# Patient Record
Sex: Female | Born: 1975 | Race: Black or African American | Hispanic: No | Marital: Single | Smoking: Never smoker
Health system: Southern US, Community
[De-identification: ages and names within clinical notes are randomized; demographics above are authoritative.]

---

## 2002-01-06 ENCOUNTER — Other Ambulatory Visit: Admission: RE | Admit: 2002-01-06 | Discharge: 2002-01-06 | Payer: Self-pay | Admitting: Obstetrics and Gynecology

## 2002-03-06 ENCOUNTER — Other Ambulatory Visit: Admission: RE | Admit: 2002-03-06 | Discharge: 2002-03-06 | Payer: Self-pay | Admitting: *Deleted

## 2005-03-16 ENCOUNTER — Encounter: Admission: RE | Admit: 2005-03-16 | Discharge: 2005-03-16 | Payer: Self-pay | Admitting: Gastroenterology

## 2005-10-23 ENCOUNTER — Encounter: Admission: RE | Admit: 2005-10-23 | Discharge: 2005-10-23 | Payer: Self-pay | Admitting: Gastroenterology

## 2007-09-21 ENCOUNTER — Encounter: Admission: RE | Admit: 2007-09-21 | Discharge: 2007-09-21 | Payer: Self-pay | Admitting: Internal Medicine

## 2007-11-01 ENCOUNTER — Encounter: Admission: RE | Admit: 2007-11-01 | Discharge: 2007-11-01 | Payer: Self-pay | Admitting: Internal Medicine

## 2010-05-20 ENCOUNTER — Other Ambulatory Visit
Admission: RE | Admit: 2010-05-20 | Discharge: 2010-05-20 | Payer: Self-pay | Source: Home / Self Care | Admitting: Family Medicine

## 2010-05-29 ENCOUNTER — Encounter
Admission: RE | Admit: 2010-05-29 | Discharge: 2010-05-29 | Payer: Self-pay | Source: Home / Self Care | Attending: Family Medicine | Admitting: Family Medicine

## 2010-06-15 ENCOUNTER — Encounter: Payer: Self-pay | Admitting: Family Medicine

## 2010-06-25 ENCOUNTER — Encounter: Payer: Self-pay | Admitting: Family Medicine

## 2010-07-10 ENCOUNTER — Other Ambulatory Visit: Payer: Self-pay | Admitting: Family Medicine

## 2010-07-10 DIAGNOSIS — N852 Hypertrophy of uterus: Secondary | ICD-10-CM

## 2010-07-12 ENCOUNTER — Ambulatory Visit
Admission: RE | Admit: 2010-07-12 | Discharge: 2010-07-12 | Disposition: A | Discharge status: Home / Self Care | Payer: 59 | Source: Ambulatory Visit | Attending: Family Medicine | Admitting: Family Medicine

## 2010-07-12 DIAGNOSIS — N852 Hypertrophy of uterus: Secondary | ICD-10-CM

## 2011-07-15 ENCOUNTER — Ambulatory Visit (INDEPENDENT_AMBULATORY_CARE_PROVIDER_SITE_OTHER): Payer: 59 | Admitting: Family Medicine

## 2011-07-15 VITALS — BP 125/83 | HR 77 | Temp 97.9°F | Resp 16 | Ht 65.0 in | Wt 223.4 lb

## 2011-07-15 DIAGNOSIS — N390 Urinary tract infection, site not specified: Secondary | ICD-10-CM

## 2011-07-15 DIAGNOSIS — R350 Frequency of micturition: Secondary | ICD-10-CM

## 2011-07-15 LAB — POCT URINALYSIS DIPSTICK
Bilirubin, UA: NEGATIVE
Glucose, UA: NEGATIVE
Nitrite, UA: NEGATIVE
Protein, UA: 100
Spec Grav, UA: 1.025
pH, UA: 6.5

## 2011-07-15 LAB — POCT UA - MICROSCOPIC ONLY
Bacteria, U Microscopic: NEGATIVE
Casts, Ur, LPF, POC: NEGATIVE
Crystals, Ur, HPF, POC: NEGATIVE
Mucus, UA: NEGATIVE
Yeast, UA: NEGATIVE

## 2011-07-15 MED ORDER — FLUCONAZOLE 150 MG PO TABS: ORAL_TABLET | ORAL | Status: AC

## 2011-07-15 MED ORDER — CIPROFLOXACIN HCL 250 MG PO TABS: 250.0000 mg | ORAL_TABLET | Freq: Two times a day (BID) | ORAL | Status: AC

## 2011-07-15 NOTE — Progress Notes (Signed)
  Subjective:    Patient ID: Lauren Gilbert, female    DOB: 01/18/1976, 36 y.o.   MRN: [id number]  Urinary Tract Infection  This is a new problem. The current episode started today. The problem occurs every urination. The problem has been gradually worsening. The pain is at a severity of 4/10. The pain is moderate. There has been no fever. There is no history of pyelonephritis. Associated symptoms include hematuria and urgency. Pertinent negatives include no flank pain or nausea.      Review of Systems  Gastrointestinal: Negative for nausea.  Genitourinary: Positive for urgency and hematuria. Negative for flank pain.       Objective:   Physical Exam  Constitutional: She appears well-developed and well-nourished.  HENT:  Mouth/Throat: Oropharynx is clear and moist.  Neck: Neck supple.  Cardiovascular: Normal rate, regular rhythm and normal heart sounds.   Pulmonary/Chest: Effort normal and breath sounds normal.  Abdominal: There is tenderness (suprapubic).  Neurological: She is alert.  Skin: Skin is warm.      Results for orders placed in visit on 07/15/11  POCT URINALYSIS DIPSTICK      Component Value Range   Color, UA red     Clarity, UA cloudy     Glucose, UA negative     Bilirubin, UA negative     Ketones, UA trace     Spec Grav, UA 1.025     Blood, UA large     pH, UA 6.5     Protein, UA 100     Urobilinogen, UA 1.0     Nitrite, UA negative     Leukocytes, UA large (3+)    POCT UA - MICROSCOPIC ONLY      Component Value Range   WBC, Ur, HPF, POC TNTC     RBC, urine, microscopic TNTC     Bacteria, U Microscopic negative     Mucus, UA negative     Epithelial cells, urine per micros 1-5     Crystals, Ur, HPF, POC negative     Casts, Ur, LPF, POC negative     Yeast, UA negative    URINE CULTURE      Component Value Range   Colony Count 30,000 COLONIES/ML     Organism ID, Bacteria Multiple bacterial morphotypes present, none     Organism ID, Bacteria  predominant. Suggest appropriate recollection if      Organism ID, Bacteria clinically indicated.         Assessment & Plan:   1. Frequency of urination  POCT urinalysis dipstick, POCT UA - Microscopic Only, Urine culture, ciprofloxacin (CIPRO) 250 MG tablet, fluconazole (DIFLUCAN) 150 MG tablet

## 2011-07-17 LAB — URINE CULTURE: Colony Count: 30000

## 2012-08-11 ENCOUNTER — Other Ambulatory Visit: Payer: Self-pay | Admitting: Family Medicine

## 2012-08-11 DIAGNOSIS — E010 Iodine-deficiency related diffuse (endemic) goiter: Secondary | ICD-10-CM

## 2012-08-15 ENCOUNTER — Ambulatory Visit
Admission: RE | Admit: 2012-08-15 | Discharge: 2012-08-15 | Disposition: A | Discharge status: Home / Self Care | Payer: 59 | Source: Ambulatory Visit | Attending: Family Medicine | Admitting: Family Medicine

## 2012-08-15 DIAGNOSIS — E010 Iodine-deficiency related diffuse (endemic) goiter: Secondary | ICD-10-CM

## 2013-04-05 ENCOUNTER — Ambulatory Visit: Payer: 59 | Attending: Family

## 2013-09-21 ENCOUNTER — Encounter (INDEPENDENT_AMBULATORY_CARE_PROVIDER_SITE_OTHER): Payer: Self-pay

## 2013-09-21 ENCOUNTER — Other Ambulatory Visit: Payer: Self-pay | Admitting: Family

## 2013-09-21 ENCOUNTER — Ambulatory Visit
Admission: RE | Admit: 2013-09-21 | Discharge: 2013-09-21 | Disposition: A | Discharge status: Home / Self Care | Payer: 59 | Source: Ambulatory Visit | Attending: Family | Admitting: Family

## 2013-09-21 DIAGNOSIS — M79609 Pain in unspecified limb: Secondary | ICD-10-CM

## 2014-03-23 ENCOUNTER — Ambulatory Visit: Payer: Self-pay

## 2019-02-03 ENCOUNTER — Ambulatory Visit (INDEPENDENT_AMBULATORY_CARE_PROVIDER_SITE_OTHER): Payer: BC Managed Care – PPO

## 2019-02-03 ENCOUNTER — Other Ambulatory Visit: Payer: Self-pay

## 2019-02-03 ENCOUNTER — Ambulatory Visit (INDEPENDENT_AMBULATORY_CARE_PROVIDER_SITE_OTHER)
Admission: EM | Admit: 2019-02-03 | Discharge: 2019-02-03 | Disposition: A | Discharge status: Home / Self Care | Payer: BC Managed Care – PPO | Source: Home / Self Care

## 2019-02-03 ENCOUNTER — Encounter (HOSPITAL_COMMUNITY): Payer: Self-pay | Admitting: Emergency Medicine

## 2019-02-03 ENCOUNTER — Observation Stay (HOSPITAL_COMMUNITY)
Admission: EM | Admit: 2019-02-03 | Discharge: 2019-02-05 | Disposition: A | Discharge status: Home / Self Care | Payer: BC Managed Care – PPO | Attending: General Surgery | Admitting: General Surgery

## 2019-02-03 DIAGNOSIS — R140 Abdominal distension (gaseous): Secondary | ICD-10-CM

## 2019-02-03 DIAGNOSIS — R109 Unspecified abdominal pain: Secondary | ICD-10-CM | POA: Diagnosis present

## 2019-02-03 DIAGNOSIS — Z20828 Contact with and (suspected) exposure to other viral communicable diseases: Secondary | ICD-10-CM | POA: Diagnosis not present

## 2019-02-03 DIAGNOSIS — K8010 Calculus of gallbladder with chronic cholecystitis without obstruction: Principal | ICD-10-CM | POA: Insufficient documentation

## 2019-02-03 DIAGNOSIS — K810 Acute cholecystitis: Secondary | ICD-10-CM | POA: Diagnosis present

## 2019-02-03 DIAGNOSIS — R1084 Generalized abdominal pain: Secondary | ICD-10-CM | POA: Insufficient documentation

## 2019-02-03 DIAGNOSIS — K5900 Constipation, unspecified: Secondary | ICD-10-CM

## 2019-02-03 DIAGNOSIS — K819 Cholecystitis, unspecified: Secondary | ICD-10-CM

## 2019-02-03 DIAGNOSIS — K660 Peritoneal adhesions (postprocedural) (postinfection): Secondary | ICD-10-CM | POA: Diagnosis not present

## 2019-02-03 LAB — CBC
HCT: 38.2 % (ref 36.0–46.0)
Hemoglobin: 12.7 g/dL (ref 12.0–15.0)
MCH: 28.6 pg (ref 26.0–34.0)
MCHC: 33.2 g/dL (ref 30.0–36.0)
MCV: 86 fL (ref 80.0–100.0)
Platelets: 414 10*3/uL — ABNORMAL HIGH (ref 150–400)
RBC: 4.44 MIL/uL (ref 3.87–5.11)
RDW: 12.9 % (ref 11.5–15.5)
WBC: 17.9 10*3/uL — ABNORMAL HIGH (ref 4.0–10.5)
nRBC: 0 % (ref 0.0–0.2)

## 2019-02-03 LAB — COMPREHENSIVE METABOLIC PANEL
ALT: 29 U/L (ref 0–44)
AST: 25 U/L (ref 15–41)
Albumin: 3.6 g/dL (ref 3.5–5.0)
Alkaline Phosphatase: 61 U/L (ref 38–126)
Anion gap: 9 (ref 5–15)
BUN: <5 mg/dL — ABNORMAL LOW (ref 6–20)
CO2: 28 mmol/L (ref 22–32)
Calcium: 9.1 mg/dL (ref 8.9–10.3)
Chloride: 101 mmol/L (ref 98–111)
Creatinine, Ser: 0.9 mg/dL (ref 0.44–1.00)
GFR calc Af Amer: >60 mL/min (ref >60)
GFR calc non Af Amer: >60 mL/min (ref >60)
Glucose, Bld: 104 mg/dL — ABNORMAL HIGH (ref 70–99)
Potassium: 3.5 mmol/L (ref 3.5–5.1)
Sodium: 138 mmol/L (ref 135–145)
Total Bilirubin: 1.1 mg/dL (ref 0.3–1.2)
Total Protein: 7.8 g/dL (ref 6.5–8.1)

## 2019-02-03 LAB — LIPASE, BLOOD: Lipase: 20 U/L (ref 11–51)

## 2019-02-03 NOTE — ED Triage Notes (Signed)
Pt c/o abd pain, constipation. Was sent from UC for further f/u.

## 2019-02-03 NOTE — ED Provider Notes (Signed)
[facility]    CSN: [id number] Arrival date & time: 02/03/19  1557      History   Chief Complaint Chief Complaint  Patient presents with  . Abdominal Pain  . Constipation    HPI Lauren Gilbert is a 43 y.o. female.   Lauren Gilbert presents with complaints of abdominal pain and constipation. This started approximately three days ago, she woke with intense pain. She has had poor sleep due to pain. No specific fevers but has had some night sweats. She feels intense pain at times, for a day or two felt like she couldn't burp or pass gas. She took miralax and was able to pass "small pellets" of stool. Some flatulance still but not a lot. Some burping. Has been drinking water and ginger ale. She vomited the night this started, 9/8, the meal she had for dinner. No blood to vomit. States she feels like the "food is blocked." she has to " breathe through the pain" as if she "is in labor." even talking can increase her pain. She has been eating only small bites of foods such as crackers. Last night ate chicken wings and fried rice and then symptoms worsened again. Normal urination. Just started her period. Denies any previous similar. No previous abdominal surgeries. States she feels she has had constipation issues for some time now. Has not had this evaluated in the past for this.     ROS per HPI, negative if not otherwise mentioned.      History reviewed. No pertinent past medical history.  There are no active problems to display for this patient.   History reviewed. No pertinent surgical history.  OB History   No obstetric history on file.      Home Medications    Prior to Admission medications   Medication Sig Start Date End Date Taking? Authorizing Provider  buPROPion (WELLBUTRIN) 75 MG tablet Take 75 mg by mouth 2 (two) times daily.    [provider]    Family History History reviewed. No pertinent family history.  Social History Social History    Tobacco Use  . Smoking status: Never Smoker  . Smokeless tobacco: Never Used  Substance Use Topics  . Alcohol use: Yes    Comment: ocassional  . Drug use: Never     Allergies   Patient has no known allergies.   Review of Systems Review of Systems   Physical Exam Triage Vital Signs ED Triage Vitals  Enc Vitals Group     BP 02/03/19 1611 (!) 135/102     Pulse Rate 02/03/19 1611 (!) 103     Resp 02/03/19 1611 18     Temp 02/03/19 1611 98.9 F (37.2 C)     Temp Source 02/03/19 1611 Temporal     SpO2 02/03/19 1611 99 %     Weight --      Height --      Head Circumference --      Peak Flow --      Pain Score 02/03/19 1614 7     Pain Loc --      Pain Edu? --      Excl. in GC? --    No data found.  Updated Vital Signs BP (!) 135/102 (BP Location: Right Wrist)   Pulse (!) 103   Temp 98.9 F (37.2 C) (Temporal)   Resp 18   LMP 02/03/2019 (Exact Date)   SpO2 99%   Physical Exam Constitutional:  Appearance: She is well-developed.     Comments: Patient does appear quite uncomfortable with movement, becoming tachypneic due to pain.   Cardiovascular:     Rate and Rhythm: Tachycardia present.     Heart sounds: Normal heart sounds.  Pulmonary:     Effort: Pulmonary effort is normal.  Abdominal:     Palpations: Abdomen is soft.     Tenderness: There is abdominal tenderness in the right upper quadrant, epigastric area, periumbilical area and left upper quadrant. There is no guarding or rebound.  Skin:    General: Skin is warm and dry.  Neurological:     Mental Status: She is alert and oriented to person, place, and time.      UC Treatments / Results  Labs (all labs ordered are listed, but only abnormal results are displayed) Labs Reviewed  CBC - Abnormal; Notable for the following components:      Result Value   WBC 17.9 (*)    Platelets 414 (*)    All other components within normal limits  COMPREHENSIVE METABOLIC PANEL - Abnormal; Notable for the  following components:   Glucose, Bld 104 (*)    BUN <5 (*)    All other components within normal limits  LIPASE, BLOOD    EKG   Radiology Dg Abd 2 Views  Result Date: 02/03/2019 CLINICAL DATA:  Acute abdominal pain for 4 days. EXAM: ABDOMEN - 2 VIEW COMPARISON:  None. FINDINGS: The bowel gas pattern is normal. There is no evidence of free air. No radio-opaque calculi or other significant radiographic abnormality is seen. IMPRESSION: Negative. Electronically Signed   By: Margarette Canada M.D.   On: 02/03/2019 18:02    Procedures Procedures (including critical care time)  Medications Ordered in UC Medications - No data to display  Initial Impression / Assessment and Plan / UC Course  I have reviewed the triage vital signs and the nursing notes.  Pertinent labs & imaging results that were available during my care of the patient were reviewed by me and considered in my medical decision making (see chart for details).     Quite significant abdominal pain, mild tachycardia. Endorses constipation, unable to pass large stool despite miralax. Abdominal films without indication of obstruction and gas pattern WNL. Afebrile here tonight. Patient agreeable to increasing miralax dosing with lab check with strict er precautions.    Labs returned with notable WBC of 17.9. normal lipase and unremarkable CMP. I do find this concerning given tachycardia and severity of pain. Called patient and recommended that she go to the ER for further evaluation of her abdominal pain. Patient verbalized understanding and agreeable to plan.   Final Clinical Impressions(s) / UC Diagnoses   Final diagnoses:  Constipation, unspecified constipation type  Generalized abdominal pain  Abdominal bloating     Discharge Instructions     You can titrate miralax until you have a large bowel movement, I would take a dose tonight and if you stay up late enough even a second dose.  Repeat dose tomorrow morning and tomorrow  evening (unless have passed a large BM).  Then may take daily as needed.  Drink plenty of water.  We will obtain some labs to ensure no obvious concerns going on, and I will call you if I am concerned with these.  Any worsening of symptoms- increased pain, fevers, or otherwise worsening- please go to the ER.  Please establish with a PCP as you may need GI referral for persistent issues.  ED Prescriptions    None     Controlled Substance Prescriptions [place] Controlled Substance Registry consulted? Not Applicable   Georgetta HaberBurky,  B, NP 02/03/19 2041

## 2019-02-03 NOTE — Discharge Instructions (Signed)
You can titrate miralax until you have a large bowel movement, I would take a dose tonight and if you stay up late enough even a second dose.  Repeat dose tomorrow morning and tomorrow evening (unless have passed a large BM).  Then may take daily as needed.  Drink plenty of water.  We will obtain some labs to ensure no obvious concerns going on, and I will call you if I am concerned with these.  Any worsening of symptoms- increased pain, fevers, or otherwise worsening- please go to the ER.  Please establish with a PCP as you may need GI referral for persistent issues.

## 2019-02-03 NOTE — ED Triage Notes (Signed)
Pt states her last BM was on Monday, with a description of it being just pellets.  On Tuesday she woke up with a lot of pain in her diaphragm.  She has taken two different medications for constipation with no relief.  On Thursday night she had another small BM described as pellets again. She reports bloating, cramping, gas and a lot of discomfort.

## 2019-02-04 ENCOUNTER — Encounter (HOSPITAL_COMMUNITY)
Admission: EM | Disposition: A | Discharge status: Home / Self Care | Payer: Self-pay | Source: Home / Self Care | Attending: Emergency Medicine

## 2019-02-04 ENCOUNTER — Emergency Department (HOSPITAL_COMMUNITY): Payer: BC Managed Care – PPO

## 2019-02-04 ENCOUNTER — Emergency Department (HOSPITAL_COMMUNITY): Payer: BC Managed Care – PPO | Admitting: Certified Registered Nurse Anesthetist

## 2019-02-04 ENCOUNTER — Encounter (HOSPITAL_COMMUNITY): Payer: Self-pay | Admitting: Radiology

## 2019-02-04 DIAGNOSIS — K810 Acute cholecystitis: Secondary | ICD-10-CM | POA: Diagnosis present

## 2019-02-04 HISTORY — PX: CHOLECYSTECTOMY: SHX55

## 2019-02-04 LAB — I-STAT BETA HCG BLOOD, ED (MC, WL, AP ONLY): I-stat hCG, quantitative: <5 m[IU]/mL (ref <5)

## 2019-02-04 LAB — SARS CORONAVIRUS 2 BY RT PCR (HOSPITAL ORDER, PERFORMED IN ~~LOC~~ HOSPITAL LAB): SARS Coronavirus 2: NEGATIVE

## 2019-02-04 SURGERY — LAPAROSCOPIC CHOLECYSTECTOMY WITH INTRAOPERATIVE CHOLANGIOGRAM
Anesthesia: General | Site: Abdomen

## 2019-02-04 MED ORDER — MIDAZOLAM HCL 2 MG/2ML IJ SOLN
INTRAMUSCULAR | Status: AC
  Filled 2019-02-04: qty 2

## 2019-02-04 MED ORDER — PROPOFOL 10 MG/ML IV BOLUS
INTRAVENOUS | Status: AC
  Filled 2019-02-04: qty 20

## 2019-02-04 MED ORDER — MIDAZOLAM HCL 2 MG/2ML IJ SOLN
INTRAMUSCULAR | Status: DC | PRN
  Administered 2019-02-04: 2 mg via INTRAVENOUS

## 2019-02-04 MED ORDER — SUCCINYLCHOLINE CHLORIDE 200 MG/10ML IV SOSY
PREFILLED_SYRINGE | INTRAVENOUS | Status: DC | PRN
  Administered 2019-02-04: 120 mg via INTRAVENOUS

## 2019-02-04 MED ORDER — LIDOCAINE 2% (20 MG/ML) 5 ML SYRINGE
INTRAMUSCULAR | Status: AC
  Filled 2019-02-04: qty 5

## 2019-02-04 MED ORDER — DEXAMETHASONE SODIUM PHOSPHATE 10 MG/ML IJ SOLN
INTRAMUSCULAR | Status: DC | PRN
  Administered 2019-02-04: 5 mg via INTRAVENOUS

## 2019-02-04 MED ORDER — MORPHINE SULFATE (PF) 2 MG/ML IV SOLN: 2.0000 mg | INTRAVENOUS | Status: DC | PRN

## 2019-02-04 MED ORDER — KETOROLAC TROMETHAMINE 15 MG/ML IJ SOLN
INTRAMUSCULAR | Status: AC
  Filled 2019-02-04: qty 2

## 2019-02-04 MED ORDER — FENTANYL CITRATE (PF) 100 MCG/2ML IJ SOLN: 25.0000 ug | INTRAMUSCULAR | Status: DC | PRN

## 2019-02-04 MED ORDER — GABAPENTIN 300 MG PO CAPS
ORAL_CAPSULE | ORAL | Status: AC
  Administered 2019-02-04: 300 mg
  Filled 2019-02-04: qty 1

## 2019-02-04 MED ORDER — ONDANSETRON HCL 4 MG/2ML IJ SOLN: 4.0000 mg | Freq: Four times a day (QID) | INTRAMUSCULAR | Status: DC | PRN

## 2019-02-04 MED ORDER — BUPIVACAINE HCL 0.25 % IJ SOLN
INTRAMUSCULAR | Status: DC | PRN
  Administered 2019-02-04: 10 mL

## 2019-02-04 MED ORDER — FENTANYL CITRATE (PF) 250 MCG/5ML IJ SOLN
INTRAMUSCULAR | Status: DC | PRN
  Administered 2019-02-04 (×2): 50 ug via INTRAVENOUS
  Administered 2019-02-04 (×3): 100 ug via INTRAVENOUS
  Administered 2019-02-04: 50 ug via INTRAVENOUS

## 2019-02-04 MED ORDER — OXYCODONE HCL 5 MG/5ML PO SOLN: 5.0000 mg | Freq: Once | ORAL | Status: DC | PRN

## 2019-02-04 MED ORDER — FENTANYL CITRATE (PF) 250 MCG/5ML IJ SOLN
INTRAMUSCULAR | Status: AC
  Filled 2019-02-04: qty 5

## 2019-02-04 MED ORDER — SUGAMMADEX SODIUM 200 MG/2ML IV SOLN
INTRAVENOUS | Status: DC | PRN
  Administered 2019-02-04: 400 mg via INTRAVENOUS

## 2019-02-04 MED ORDER — IOHEXOL 300 MG/ML  SOLN
100.0000 mL | Freq: Once | INTRAMUSCULAR | Status: AC | PRN
  Administered 2019-02-04: 09:00:00 100 mL via INTRAVENOUS

## 2019-02-04 MED ORDER — BUPIVACAINE HCL (PF) 0.25 % IJ SOLN
INTRAMUSCULAR | Status: AC
  Filled 2019-02-04: qty 30

## 2019-02-04 MED ORDER — SUCCINYLCHOLINE CHLORIDE 200 MG/10ML IV SOSY
PREFILLED_SYRINGE | INTRAVENOUS | Status: AC
  Filled 2019-02-04: qty 10

## 2019-02-04 MED ORDER — ONDANSETRON 4 MG PO TBDP: 4.0000 mg | ORAL_TABLET | Freq: Four times a day (QID) | ORAL | Status: DC | PRN

## 2019-02-04 MED ORDER — HYDRALAZINE HCL 20 MG/ML IJ SOLN: 10.0000 mg | INTRAMUSCULAR | Status: DC | PRN

## 2019-02-04 MED ORDER — ACETAMINOPHEN 500 MG PO TABS
1000.0000 mg | ORAL_TABLET | Freq: Once | ORAL | Status: AC
  Administered 2019-02-04: 13:00:00 1000 mg via ORAL
  Filled 2019-02-04: qty 2

## 2019-02-04 MED ORDER — PROPOFOL 10 MG/ML IV BOLUS
INTRAVENOUS | Status: DC | PRN
  Administered 2019-02-04: 200 mg via INTRAVENOUS

## 2019-02-04 MED ORDER — GABAPENTIN 600 MG PO TABS
300.0000 mg | ORAL_TABLET | Freq: Once | ORAL | Status: DC
  Filled 2019-02-04: qty 0.5

## 2019-02-04 MED ORDER — BUPIVACAINE-EPINEPHRINE (PF) 0.25% -1:200000 IJ SOLN
INTRAMUSCULAR | Status: AC
  Filled 2019-02-04: qty 30

## 2019-02-04 MED ORDER — PIPERACILLIN-TAZOBACTAM 3.375 G IVPB 30 MIN
3.3750 g | Freq: Once | INTRAVENOUS | Status: AC
  Administered 2019-02-04: 3.375 g via INTRAVENOUS
  Filled 2019-02-04: qty 50

## 2019-02-04 MED ORDER — LIDOCAINE 2% (20 MG/ML) 5 ML SYRINGE
INTRAMUSCULAR | Status: DC | PRN
  Administered 2019-02-04: 40 mg via INTRAVENOUS

## 2019-02-04 MED ORDER — ROCURONIUM BROMIDE 10 MG/ML (PF) SYRINGE
PREFILLED_SYRINGE | INTRAVENOUS | Status: DC | PRN
  Administered 2019-02-04: 60 mg via INTRAVENOUS

## 2019-02-04 MED ORDER — EPHEDRINE SULFATE-NACL 50-0.9 MG/10ML-% IV SOSY
PREFILLED_SYRINGE | INTRAVENOUS | Status: DC | PRN
  Administered 2019-02-04: 10 mg via INTRAVENOUS

## 2019-02-04 MED ORDER — HYDROCODONE-ACETAMINOPHEN 5-325 MG PO TABS: 1.0000 | ORAL_TABLET | ORAL | Status: DC | PRN

## 2019-02-04 MED ORDER — ROCURONIUM BROMIDE 10 MG/ML (PF) SYRINGE
PREFILLED_SYRINGE | INTRAVENOUS | Status: AC
  Filled 2019-02-04: qty 10

## 2019-02-04 MED ORDER — ONDANSETRON HCL 4 MG/2ML IJ SOLN
INTRAMUSCULAR | Status: DC | PRN
  Administered 2019-02-04: 4 mg via INTRAVENOUS

## 2019-02-04 MED ORDER — KETOROLAC TROMETHAMINE 30 MG/ML IJ SOLN
30.0000 mg | Freq: Once | INTRAMUSCULAR | Status: AC
  Administered 2019-02-04: 13:00:00 30 mg via INTRAVENOUS

## 2019-02-04 MED ORDER — SODIUM CHLORIDE 0.9 % IR SOLN
Status: DC | PRN
  Administered 2019-02-04 (×2): 1000 mL

## 2019-02-04 MED ORDER — DEXTROSE-NACL 5-0.45 % IV SOLN
INTRAVENOUS | Status: DC
  Administered 2019-02-04 – 2019-02-05 (×2): via INTRAVENOUS

## 2019-02-04 MED ORDER — 0.9 % SODIUM CHLORIDE (POUR BTL) OPTIME
TOPICAL | Status: DC | PRN
  Administered 2019-02-04: 1000 mL

## 2019-02-04 MED ORDER — ENOXAPARIN SODIUM 40 MG/0.4ML ~~LOC~~ SOLN
40.0000 mg | SUBCUTANEOUS | Status: DC
  Administered 2019-02-05: 08:00:00 40 mg via SUBCUTANEOUS
  Filled 2019-02-04: qty 0.4

## 2019-02-04 MED ORDER — OXYCODONE HCL 5 MG PO TABS: 5.0000 mg | ORAL_TABLET | Freq: Once | ORAL | Status: DC | PRN

## 2019-02-04 MED ORDER — LACTATED RINGERS IV SOLN
INTRAVENOUS | Status: DC
  Administered 2019-02-04 (×2): via INTRAVENOUS

## 2019-02-04 SURGICAL SUPPLY — 48 items
ADH SKN CLS APL DERMABOND .7 (GAUZE/BANDAGES/DRESSINGS) ×1
ADH SKN CLS LQ APL DERMABOND (GAUZE/BANDAGES/DRESSINGS) ×1
APL PRP STRL LF DISP 70% ISPRP (MISCELLANEOUS) ×1
APPLIER CLIP ROT 10 11.4 M/L (STAPLE)
APR CLP MED LRG 11.4X10 (STAPLE)
BAG SPEC RTRVL 10 TROC 200 (ENDOMECHANICALS) ×1
BLADE CLIPPER SURG (BLADE) IMPLANT
CANISTER SUCT 3000ML PPV (MISCELLANEOUS) ×3 IMPLANT
CATH CHOLANG 76X19 KUMAR (CATHETERS) ×3 IMPLANT
CHLORAPREP W/TINT 26 (MISCELLANEOUS) ×3 IMPLANT
CLIP APPLIE ROT 10 11.4 M/L (STAPLE) IMPLANT
CLIP VESOLOCK MED LG 6/CT (CLIP) IMPLANT
COVER MAYO STAND STRL (DRAPES) ×3 IMPLANT
COVER SURGICAL LIGHT HANDLE (MISCELLANEOUS) ×3 IMPLANT
COVER WAND RF STERILE (DRAPES) ×3 IMPLANT
DERMABOND ADHESIVE PROPEN (GAUZE/BANDAGES/DRESSINGS) ×2
DERMABOND ADVANCED (GAUZE/BANDAGES/DRESSINGS) ×2
DERMABOND ADVANCED .7 DNX12 (GAUZE/BANDAGES/DRESSINGS) ×1 IMPLANT
DERMABOND ADVANCED .7 DNX6 (GAUZE/BANDAGES/DRESSINGS) IMPLANT
DRAPE C-ARM 42X72 X-RAY (DRAPES) ×3 IMPLANT
ELECT REM PT RETURN 9FT ADLT (ELECTROSURGICAL) ×3
ELECTRODE REM PT RTRN 9FT ADLT (ELECTROSURGICAL) ×1 IMPLANT
GLOVE BIOGEL PI IND STRL 7.0 (GLOVE) ×1 IMPLANT
GLOVE BIOGEL PI INDICATOR 7.0 (GLOVE) ×2
GLOVE SURG SS PI 7.0 STRL IVOR (GLOVE) ×3 IMPLANT
GOWN STRL REUS W/ TWL LRG LVL3 (GOWN DISPOSABLE) ×3 IMPLANT
GOWN STRL REUS W/TWL LRG LVL3 (GOWN DISPOSABLE) ×9
GRASPER SUT TROCAR 14GX15 (MISCELLANEOUS) ×3 IMPLANT
KIT BASIN OR (CUSTOM PROCEDURE TRAY) ×3 IMPLANT
KIT TURNOVER KIT B (KITS) ×3 IMPLANT
NEEDLE 22X1 1/2 (OR ONLY) (NEEDLE) ×3 IMPLANT
NS IRRIG 1000ML POUR BTL (IV SOLUTION) ×3 IMPLANT
PAD ARMBOARD 7.5X6 YLW CONV (MISCELLANEOUS) ×3 IMPLANT
POUCH RETRIEVAL ECOSAC 10 (ENDOMECHANICALS) ×1 IMPLANT
POUCH RETRIEVAL ECOSAC 10MM (ENDOMECHANICALS) ×2
SCISSORS LAP 5X35 DISP (ENDOMECHANICALS) ×3 IMPLANT
SET IRRIG TUBING LAPAROSCOPIC (IRRIGATION / IRRIGATOR) ×3 IMPLANT
SET TUBE SMOKE EVAC HIGH FLOW (TUBING) ×3 IMPLANT
SLEEVE ENDOPATH XCEL 5M (ENDOMECHANICALS) ×6 IMPLANT
SPECIMEN JAR SMALL (MISCELLANEOUS) ×3 IMPLANT
STOPCOCK 4 WAY LG BORE MALE ST (IV SETS) ×3 IMPLANT
SUT MNCRL AB 4-0 PS2 18 (SUTURE) ×5 IMPLANT
TOWEL GREEN STERILE (TOWEL DISPOSABLE) ×3 IMPLANT
TOWEL GREEN STERILE FF (TOWEL DISPOSABLE) ×3 IMPLANT
TRAY LAPAROSCOPIC MC (CUSTOM PROCEDURE TRAY) ×3 IMPLANT
TROCAR XCEL 12X100 BLDLESS (ENDOMECHANICALS) ×3 IMPLANT
TROCAR XCEL NON-BLD 5MMX100MML (ENDOMECHANICALS) ×3 IMPLANT
WATER STERILE IRR 1000ML POUR (IV SOLUTION) ×3 IMPLANT

## 2019-02-04 NOTE — Consult Note (Signed)
Reason for Consult:Acute Cholecystitis Referring Physician: Dr. Frederick Peers, MD  Lauren Gilbert is an 43 y.o. female.  HPI:   Lauren Gilbert is a 43 yo female with a history of IBS-C who presented to the ED on 02/04/2019 for severe abdominal pain.  She reports that the epigastric pain began on Tuesday, 01/31/2019, awaking her from sleep.  The pain has persisted since Tuesday, and worsens with ambulation and eating.  She has associated decreased appetite, nausea, and  one episode of vomiting on Tuesday.  She denies fevers, or chills.  She also notes constipation throughout this week, passing only a small amount of hard stool.  She has been taking miralax, for no significant relief of symptoms.    At the ED, patient's labs consistent with WBC elevation of 17.9, normal liver enzymes, normal lipase.  CT abdomen demonstrated gallbladder distension and gallbladder wall thickening of 12 mm.  S/sx consistent with acute cholecystitis.  Patient has been started on treatment with zosyn in the ED.   Patient has been NPO since Friday morning 02/03/2019.  No significant past medical history.    No past surgical history.    No family history on file.  Social History:  reports that she has never smoked. She has never used smokeless tobacco. She reports current alcohol use. She reports that she does not use drugs.  Allergies: No Known Allergies  Medications:   Results for orders placed or performed during the hospital encounter of 02/03/19 (from the past 48 hour(s))  I-Stat Beta hCG blood, ED ([facility], [facility], [facility] only)     Status: None   Collection Time: 02/04/19  7:58 AM  Result Value Ref Range   I-stat hCG, quantitative <5.0 <5 mIU/mL   Comment 3            Comment:   GEST. AGE      CONC.  (mIU/mL)   <=1 WEEK        5 - 50     2 WEEKS       50 - 500     3 WEEKS       100 - 10,000     4 WEEKS     1,000 - 30,000        FEMALE AND NON-PREGNANT FEMALE:     LESS THAN 5 mIU/mL   SARS Coronavirus 2 [facility]  order, Performed in [facility] hospital lab) Nasopharyngeal Nasopharyngeal Swab     Status: None   Collection Time: 02/04/19 10:50 AM   Specimen: Nasopharyngeal Swab  Result Value Ref Range   SARS Coronavirus 2 NEGATIVE NEGATIVE    Comment: (NOTE) If result is NEGATIVE SARS-CoV-2 target nucleic acids are NOT DETECTED. The SARS-CoV-2 RNA is generally detectable in upper and lower  respiratory specimens during the acute phase of infection. The lowest  concentration of SARS-CoV-2 viral copies this assay can detect is 250  copies / mL. A negative result does not preclude SARS-CoV-2 infection  and should not be used as the sole basis for treatment or other  patient management decisions.  A negative result may occur with  improper specimen collection / handling, submission of specimen other  than nasopharyngeal swab, presence of viral mutation(s) within the  areas targeted by this assay, and inadequate number of viral copies  (<250 copies / mL). A negative result must be combined with clinical  observations, patient history, and epidemiological information. If result is POSITIVE SARS-CoV-2 target nucleic acids are DETECTED. The SARS-CoV-2 RNA is generally  detectable in upper and lower  respiratory specimens dur ing the acute phase of infection.  Positive  results are indicative of active infection with SARS-CoV-2.  Clinical  correlation with patient history and other diagnostic information is  necessary to determine patient infection status.  Positive results do  not rule out bacterial infection or co-infection with other viruses. If result is PRESUMPTIVE POSTIVE SARS-CoV-2 nucleic acids MAY BE PRESENT.   A presumptive positive result was obtained on the submitted specimen  and confirmed on repeat testing.  While 2019 novel coronavirus  (SARS-CoV-2) nucleic acids may be present in the submitted sample  additional confirmatory testing may be necessary for epidemiological  and / or  clinical management purposes  to differentiate between  SARS-CoV-2 and other Sarbecovirus currently known to infect humans.  If clinically indicated additional testing with an alternate test  methodology [phone]) is advised. The SARS-CoV-2 RNA is generally  detectable in upper and lower respiratory sp ecimens during the acute  phase of infection. The expected result is Negative. Fact Sheet for Patients:  StrictlyIdeas.no Fact Sheet for Healthcare Providers: BankingDealers.co.za This test is not yet approved or cleared by the [place] FDA and has been authorized for detection and/or diagnosis of SARS-CoV-2 by FDA under an Emergency Use Authorization (EUA).  This EUA will remain in effect (meaning this test can be used) for the duration of the COVID-19 declaration under Section 564(b)(1) of the Act, 21 U.S.C. section 360bbb-3(b)(1), unless the authorization is terminated or revoked sooner. Performed at [facility], [place] [address], [place],  [address]     Ct Abdomen Pelvis W Contrast  Result Date: 02/04/2019 CLINICAL DATA:  Acute abdominal pain. EXAM: CT ABDOMEN AND PELVIS WITH CONTRAST TECHNIQUE: Multidetector CT imaging of the abdomen and pelvis was performed using the standard protocol following bolus administration of intravenous contrast. CONTRAST:  118mL OMNIPAQUE IOHEXOL 300 MG/ML  SOLN COMPARISON:  None. FINDINGS: Lower chest: Normal. Hepatobiliary: There is marked thickening of the gallbladder wall to 12 mm. The gallbladder is distended. No dilated bile ducts. Liver parenchyma is normal. Pancreas: Unremarkable. No pancreatic ductal dilatation or surrounding inflammatory changes. Spleen: Normal in size without focal abnormality. Adrenals/Urinary Tract: Adrenal glands are unremarkable. Kidneys are normal, without renal calculi, focal lesion, or hydronephrosis. Bladder is unremarkable. Stomach/Bowel: Stomach is within  normal limits. Appendix appears normal. No evidence of bowel wall thickening, distention, or inflammatory changes. Vascular/Lymphatic: No significant vascular findings are present. No enlarged abdominal or pelvic lymph nodes. Reproductive: Uterus and bilateral adnexa are unremarkable. 18 mm Bartholin's cyst at the right labia. Other: No abdominal wall hernia or abnormality. No abdominopelvic ascites. Musculoskeletal: No acute or significant osseous findings. IMPRESSION: Marked thickening of the gallbladder wall consistent with cholecystitis. This could be acute or chronic. Otherwise, essentially normal exam. Electronically Signed   By: Lorriane Shire M.D.   On: 02/04/2019 09:04   Dg Abd 2 Views  Result Date: 02/03/2019 CLINICAL DATA:  Acute abdominal pain for 4 days. EXAM: ABDOMEN - 2 VIEW COMPARISON:  None. FINDINGS: The bowel gas pattern is normal. There is no evidence of free air. No radio-opaque calculi or other significant radiographic abnormality is seen. IMPRESSION: Negative. Electronically Signed   By: Margarette Canada M.D.   On: 02/03/2019 18:02    Review of Systems  Constitutional: Negative for chills and fever.  Respiratory: Negative for shortness of breath.   Gastrointestinal: Positive for abdominal pain, constipation and nausea.  All other systems reviewed and are negative.  Blood pressure 130/84, pulse 72, temperature 98.4 F (36.9 C), temperature source Oral, resp. rate 12, last menstrual period 02/03/2019, SpO2 98 %. Physical Exam  Constitutional: She is oriented to person, place, and time. She appears well-developed and well-nourished. No distress.  Cardiovascular: Normal rate, regular rhythm and normal heart sounds. Exam reveals no gallop and no friction rub.  No murmur heard. Respiratory: Effort normal and breath sounds normal.  GI: Bowel sounds are normal. She exhibits no distension and no mass. There is abdominal tenderness (in the epigastric and RUQ).  Musculoskeletal: Normal  range of motion.  Neurological: She is alert and oriented to person, place, and time.  Skin: Skin is warm and dry.  Psychiatric: She has a normal mood and affect. Her behavior is normal. Judgment and thought content normal.      Assessment/Plan: [name] is a 43 yo female with acute cholecystitis.  - Continue zosyn as prescribed.    - Patient will undergo laparoscopic cholecystectomy.  Risks and benefits of procedure discussed with patient. She verbally consented to procedure.     -Patient will remain NPO prior to the procedure.    Francene BoyersElizabeth A Conrado Nance PA-S 02/04/2019, 12:28 PM

## 2019-02-04 NOTE — ED Provider Notes (Signed)
I received this patient in signout from Dr. Dayna Barker.  Briefly, patient had presented to urgent care with abdominal pain and was sent here for further evaluation after lab work showed leukocytosis.  Awaiting CT scan at time of signout.  CT shows bladder wall thickening consistent with cholecystitis.  Patient has been afebrile and nontoxic here.  Gave a dose of Zosyn and contacted general surgery, discussed with Dr. Kieth Brightly.  Patient will be admitted for further care.   Kaiyana Bedore, Wenda Overland, MD 02/04/19 [phone]

## 2019-02-04 NOTE — ED Notes (Signed)
Called CT.  They will pick up pt soon.

## 2019-02-04 NOTE — Plan of Care (Signed)
  Problem: Clinical Measurements: Goal: Will remain free from infection Outcome: Progressing   Problem: Nutrition: Goal: Adequate nutrition will be maintained Outcome: Progressing   Problem: Elimination: Goal: Will not experience complications related to bowel motility Outcome: Progressing   Problem: Pain Managment: Goal: General experience of comfort will improve Outcome: Progressing   Problem: Safety: Goal: Ability to remain free from injury will improve Outcome: Progressing   

## 2019-02-04 NOTE — Transfer of Care (Signed)
Immediate Anesthesia Transfer of Care Note  Patient: Lauren Gilbert  Procedure(s) Performed: LAPAROSCOPIC CHOLECYSTECTOMY (N/A Abdomen)  Patient Location: PACU  Anesthesia Type:General  Level of Consciousness: drowsy and patient cooperative  Airway & Oxygen Therapy: Patient Spontanous Breathing  Post-op Assessment: Report given to RN and Post -op Vital signs reviewed and stable  Post vital signs: Reviewed and stable  Last Vitals:  Vitals Value Taken Time  BP    Temp    Pulse    Resp    SpO2      Last Pain:  Vitals:   02/04/19 0543  TempSrc:   PainSc: 6       Patients Stated Pain Goal: 5 (34/91/79 1505)  Complications: No apparent anesthesia complications

## 2019-02-04 NOTE — ED Notes (Signed)
Spoke with security officers in office, instructed to have patient to take gray bag with her to short stay.

## 2019-02-04 NOTE — Anesthesia Preprocedure Evaluation (Signed)
Anesthesia Evaluation  Patient identified by MRN, date of birth, ID band Patient awake    Reviewed: Allergy & Precautions, H&P , NPO status , Patient's Chart, lab work & pertinent test results  Airway Mallampati: II   Neck ROM: full    Dental   Pulmonary neg pulmonary ROS,    breath sounds clear to auscultation       Cardiovascular negative cardio ROS   Rhythm:regular Rate:Normal     Neuro/Psych    GI/Hepatic cholelithiasis   Endo/Other    Renal/GU      Musculoskeletal   Abdominal   Peds  Hematology   Anesthesia Other Findings   Reproductive/Obstetrics                             Anesthesia Physical Anesthesia Plan  ASA: II  Anesthesia Plan: General   Post-op Pain Management:    Induction: Intravenous  PONV Risk Score and Plan: 3 and Ondansetron, Dexamethasone, Midazolam and Treatment may vary due to age or medical condition  Airway Management Planned: Oral ETT  Additional Equipment:   Intra-op Plan:   Post-operative Plan: Extubation in OR  Informed Consent: I have reviewed the patients History and Physical, chart, labs and discussed the procedure including the risks, benefits and alternatives for the proposed anesthesia with the patient or authorized representative who has indicated his/her understanding and acceptance.       Plan Discussed with: CRNA, Anesthesiologist and Surgeon  Anesthesia Plan Comments:         Anesthesia Quick Evaluation

## 2019-02-04 NOTE — Anesthesia Postprocedure Evaluation (Signed)
Anesthesia Post Note  Patient: Lauren Gilbert  Procedure(s) Performed: LAPAROSCOPIC CHOLECYSTECTOMY (N/A Abdomen)     Patient location during evaluation: PACU Anesthesia Type: General Level of consciousness: awake and alert Pain management: pain level controlled Vital Signs Assessment: post-procedure vital signs reviewed and stable Respiratory status: spontaneous breathing, nonlabored ventilation, respiratory function stable and patient connected to nasal cannula oxygen Cardiovascular status: blood pressure returned to baseline and stable Postop Assessment: no apparent nausea or vomiting Anesthetic complications: no    Last Vitals:  Vitals:   02/04/19 1653 02/04/19 2019  BP: 132/66 (!) 110/55  Pulse: 74 65  Resp: 18 (!) 22  Temp: 36.8 C 36.6 C  SpO2: 96% 95%    Last Pain:  Vitals:   02/04/19 2019  TempSrc: Oral  PainSc:                  Isabella S

## 2019-02-04 NOTE — Op Note (Signed)
PATIENT:  Lauren Gilbert  43 y.o. female  PRE-OPERATIVE DIAGNOSIS:  cholelithiasis  POST-OPERATIVE DIAGNOSIS:  cholelithiasis  PROCEDURE:  Procedure(s): LAPAROSCOPIC CHOLECYSTECTOMY   SURGEON:  Surgeon(s): Kinsinger, Arta Bruce, MD  ASSISTANT: Jeralyn Bennett  ANESTHESIA:   local and general  Indications for procedure: [name] is a 43 y.o. female with symptoms of Abdominal pain and Nausea and vomiting consistent with gallbladder disease, Confirmed by CT.  Description of procedure: The patient was brought into the operative suite, placed supine. Anesthesia was administered with endotracheal tube. Patient was strapped in place and foot board was secured. All pressure points were offloaded by foam padding. The patient was prepped and draped in the usual sterile fashion.  A small incision was made to the right of the umbilicus. A 82mm trocar was inserted into the peritoneal cavity with optical entry. Pneumoperitoneum was applied with high flow low pressure. 2 23mm trocars were placed in the RUQ. A 31mm trocar was placed in the subxiphoid space. Marcaine was infused to the subxiphoid space and lateral upper right abdomen in the transversus abdominis plane. Next the patient was placed in reverse trendelenberg. The gallbladder was tense and acutely inflamed with acute adhesions of the omentum to the gallbladder. These were taken down bluntly  The gallbladder was retracted cephalad and lateral. The peritoneum was reflected off the infundibulum working lateral to medial. The cystic duct and cystic artery were identified and further dissection revealed a critical view. The cystic duct and cystic artery were doubly clipped and ligated.   The gallbladder was removed off the liver bed with cautery. The Gallbladder was placed in a specimen bag. The gallbladder fossa was irrigated and hemostasis was applied with cautery. The gallbladder was removed via the 76mm trocar. The fascial defect was closed  with interrupted 0 vicryl suture via laparoscopic trans-fascial suture passer. Pneumoperitoneum was removed, all trocar were removed. All incisions were closed with 4-0 monocryl subcuticular stitch. The patient woke from anesthesia and was brought to PACU in stable condition. All counts were correct  Findings: acute cholecystitis  Specimen: gallbladder  Blood loss: 40 ml  Local anesthesia: 30 ml marcaine  Complications: none  PLAN OF CARE: Admit for overnight observation  PATIENT DISPOSITION:  PACU - hemodynamically stable.  Gurney Maxin, M.D. General, Bariatric, & Minimally Invasive Surgery [facility]

## 2019-02-04 NOTE — ED Provider Notes (Signed)
Emergency Department Provider Note   I have reviewed the triage vital signs and the nursing notes.   HISTORY  Chief Complaint Abdominal Pain   HPI Lauren Gilbert is a 43 y.o. female here with constipation and leukocytosis at Urgent care. Also with some general abdominal pain for the last few days. Some nausea, no vomiting. No fevers. No other associated symptoms.   No other associated or modifying symptoms.    No past medical history on file.  There are no active problems to display for this patient.   No past surgical history on file.  Current Outpatient Rx  . Order #: [id number]: [id number] Class: Historical Med    Allergies Patient has no known allergies.  No family history on file.  Social History Social History   Tobacco Use  . Smoking status: Never Smoker  . Smokeless tobacco: Never Used  Substance Use Topics  . Alcohol use: Yes    Comment: ocassional  . Drug use: Never    Review of Systems  All other systems negative except as documented in the HPI. All pertinent positives and negatives as reviewed in the HPI. ____________________________________________   PHYSICAL EXAM:  VITAL SIGNS: ED Triage Vitals  Enc Vitals Group     BP 02/03/19 2139 (!) 152/95     Pulse Rate 02/03/19 2139 (!) 104     Resp 02/03/19 2139 18     Temp 02/03/19 2139 99.4 F (37.4 C)     Temp Source 02/03/19 2139 Oral     SpO2 02/04/19 0016 99 %    Constitutional: Alert and oriented. Well appearing and in no acute distress. Eyes: Conjunctivae are normal. PERRL. EOMI. Head: Atraumatic. Nose: No congestion/rhinnorhea. Mouth/Throat: Mucous membranes are moist.  Oropharynx non-erythematous. Neck: No stridor.  No meningeal signs.   Cardiovascular: tachycardic rate, regular rhythm. Good peripheral circulation. Grossly normal heart sounds.   Respiratory: Normal respiratory effort.  No retractions. Lungs CTAB. Gastrointestinal: Soft and diffusely tender, worse in upper quadrants. No  distention.  Musculoskeletal: No lower extremity tenderness nor edema. No gross deformities of extremities. Neurologic:  Normal speech and language. No gross focal neurologic deficits are appreciated.  Skin:  Skin is warm, dry and intact. No rash noted.   ____________________________________________   LABS (all labs ordered are listed, but only abnormal results are displayed)  Labs Reviewed  BASIC METABOLIC PANEL - Abnormal; Notable for the following components:      Result Value   Glucose, Bld 128 (*)    All other components within normal limits  CBC - Abnormal; Notable for the following components:   WBC 14.2 (*)    Hemoglobin 11.4 (*)    HCT 34.6 (*)    Platelets 403 (*)    All other components within normal limits  SARS CORONAVIRUS 2 (HOSPITAL ORDER, PERFORMED IN [facility])  I-STAT BETA HCG BLOOD, ED ([facility], [facility], [facility] ONLY)  SURGICAL PATHOLOGY   ____________________________________________  RADIOLOGY  Dg Abd 2 Views  Result Date: 02/03/2019 CLINICAL DATA:  Acute abdominal pain for 4 days. EXAM: ABDOMEN - 2 VIEW COMPARISON:  None. FINDINGS: The bowel gas pattern is normal. There is no evidence of free air. No radio-opaque calculi or other significant radiographic abnormality is seen. IMPRESSION: Negative. Electronically Signed   By: Harmon PierJeffrey  Hu M.D.   On: 02/03/2019 18:02    ____________________________________________   PROCEDURES  Procedure(s) performed:   Procedures   ____________________________________________   INITIAL IMPRESSION / ASSESSMENT AND PLAN / ED COURSE  Care transferred  pending CT scan to eval for pancreatitis, PUD, perforation, cholecystitis.      Pertinent labs & imaging results that were available during my care of the patient were reviewed by me and considered in my medical decision making (see chart for details). ____________________________________________  FINAL CLINICAL IMPRESSION(S) / ED DIAGNOSES  Final diagnoses:   None     MEDICATIONS GIVEN DURING THIS VISIT:  Medications - No data to display   NEW OUTPATIENT MEDICATIONS STARTED DURING THIS VISIT:  New Prescriptions   No medications on file    Note:  This note was prepared with assistance of Dragon voice recognition software. Occasional wrong-word or sound-a-like substitutions may have occurred due to the inherent limitations of voice recognition software.   Adaley Kiene, Corene Cornea, MD 02/05/19 [phone]

## 2019-02-04 NOTE — Anesthesia Procedure Notes (Signed)
Procedure Name: Intubation Date/Time: 02/04/2019 1:44 PM Performed by: Elayne Snare, CRNA Pre-anesthesia Checklist: Patient identified, Emergency Drugs available, Suction available and Patient being monitored Patient Re-evaluated:Patient Re-evaluated prior to induction Oxygen Delivery Method: Circle System Utilized Preoxygenation: Pre-oxygenation with 100% oxygen Induction Type: IV induction, Rapid sequence and Cricoid Pressure applied Laryngoscope Size: Mac and 3 Grade View: Grade II Tube type: Oral Tube size: 7.0 mm Number of attempts: 1 Airway Equipment and Method: Stylet Placement Confirmation: ETT inserted through vocal cords under direct vision,  positive ETCO2 and breath sounds checked- equal and bilateral Secured at: 21 cm Tube secured with: Tape Dental Injury: Teeth and Oropharynx as per pre-operative assessment

## 2019-02-05 ENCOUNTER — Encounter (HOSPITAL_COMMUNITY): Payer: Self-pay | Admitting: General Surgery

## 2019-02-05 LAB — BASIC METABOLIC PANEL
Anion gap: 10 (ref 5–15)
BUN: 8 mg/dL (ref 6–20)
CO2: 26 mmol/L (ref 22–32)
Calcium: 9 mg/dL (ref 8.9–10.3)
Chloride: 102 mmol/L (ref 98–111)
Creatinine, Ser: 0.83 mg/dL (ref 0.44–1.00)
GFR calc Af Amer: >60 mL/min (ref >60)
GFR calc non Af Amer: >60 mL/min (ref >60)
Glucose, Bld: 128 mg/dL — ABNORMAL HIGH (ref 70–99)
Potassium: 3.8 mmol/L (ref 3.5–5.1)
Sodium: 138 mmol/L (ref 135–145)

## 2019-02-05 LAB — CBC
HCT: 34.6 % — ABNORMAL LOW (ref 36.0–46.0)
Hemoglobin: 11.4 g/dL — ABNORMAL LOW (ref 12.0–15.0)
MCH: 28.4 pg (ref 26.0–34.0)
MCHC: 32.9 g/dL (ref 30.0–36.0)
MCV: 86.3 fL (ref 80.0–100.0)
Platelets: 403 10*3/uL — ABNORMAL HIGH (ref 150–400)
RBC: 4.01 MIL/uL (ref 3.87–5.11)
RDW: 12.7 % (ref 11.5–15.5)
WBC: 14.2 10*3/uL — ABNORMAL HIGH (ref 4.0–10.5)
nRBC: 0 % (ref 0.0–0.2)

## 2019-02-05 MED ORDER — OXYCODONE HCL 5 MG PO TABS: 5.0000 mg | ORAL_TABLET | Freq: Four times a day (QID) | ORAL | 0 refills | Status: DC | PRN

## 2019-02-05 MED ORDER — IBUPROFEN 200 MG PO TABS: ORAL_TABLET | ORAL | Status: AC

## 2019-02-05 MED ORDER — ACETAMINOPHEN 500 MG PO TABS: ORAL_TABLET | ORAL | 0 refills | Status: AC

## 2019-02-05 NOTE — Progress Notes (Signed)
1 Day Post-Op    CC: Abdominal pain  Subjective: Doing well this a.m. up walking.  Tolerated breakfast well.  Port sites all look good.  Objective: Vital signs in last 24 hours: Temp:  [97.9 F (36.6 C)-98.4 F (36.9 C)] 97.9 F (36.6 C) (09/13 0501) Pulse Rate:  [64-100] 71 (09/13 0501) Resp:  [12-22] 16 (09/13 0501) BP: (110-144)/(55-84) 135/72 (09/13 0501) SpO2:  [95 %-100 %] 99 % (09/13 0501) Weight:  [117.9 kg-122.1 kg] 122.1 kg (09/12 1657) Last BM Date: 01/30/19 1879 IV 1000 urine Afebrile vital signs are stable BMP is stable  WBC 17.9(9/11) >>14.2(9/13)   Intake/Output from previous day: 09/12 0701 - 09/13 0700 In: 1879 [I.V.:1779; IV Piggyback:100] Out: 1000 [Urine:1000] Intake/Output this shift: No intake/output data recorded.  General appearance: alert, cooperative and no distress Resp: clear to auscultation bilaterally GI: Soft, sore, port sites all look good.  Lab Results:  Recent Labs    02/03/19 1817 02/05/19 0313  WBC 17.9* 14.2*  HGB 12.7 11.4*  HCT 38.2 34.6*  PLT 414* 403*    BMET Recent Labs    02/03/19 1817 02/05/19 0313  NA 138 138  K 3.5 3.8  CL 101 102  CO2 28 26  GLUCOSE 104* 128*  BUN <5* 8  CREATININE 0.90 0.83  CALCIUM 9.1 9.0   PT/INR No results for input(s): LABPROT, INR in the last 72 hours.  Recent Labs  Lab 02/03/19 1817  AST 25  ALT 29  ALKPHOS 61  BILITOT 1.1  PROT 7.8  ALBUMIN 3.6  CT scan 9/12 marked thickening of the gallbladder wall consistent with cholecystitis this could be acute or chronic.  Lipase     Component Value Date/Time   LIPASE 20 02/03/2019 1817   Prior to Admission medications   Not on File      Medications: . enoxaparin (LOVENOX) injection  40 mg Subcutaneous Q24H  . gabapentin  300 mg Oral Once   . dextrose 5 % and 0.45% NaCl 75 mL/hr at 02/05/19 0634  . lactated ringers 10 mL/hr at 02/04/19 1318    Assessment/Plan Hx IBS-C COVID negative  Acute  cholecystitis/cholelithiasis with nausea and vomiting Laparoscopic cholecystectomy 02/04/2019, Dr. Lurena Joiner Kinsinger  FEN: IV fluids/regular diet DVT: Lovenox ID: Zosyn preop Follow-up: [facility] clinic Plan: Home today.  Follow-up in the [facility] clinic.  LOS: 0 days    Maryan Sivak 02/05/2019 [phone]

## 2019-02-05 NOTE — Discharge Instructions (Signed)
LAPAROSCOPIC SURGERY: POST OP INSTRUCTIONS  ######################################################################  EAT Gradually transition to a high fiber diet with a fiber supplement over the next few weeks after discharge.  Start with a pureed / full liquid diet (see below)  WALK Walk an hour a day.  Control your pain to do that.    CONTROL PAIN Control pain so that you can walk, sleep, tolerate sneezing/coughing, go up/down stairs.  HAVE A BOWEL MOVEMENT DAILY Keep your bowels regular to avoid problems.  OK to try a laxative to override constipation.  OK to use an antidairrheal to slow down diarrhea.  Call if not better after 2 tries  CALL IF YOU HAVE PROBLEMS/CONCERNS Call if you are still struggling despite following these instructions. Call if you have concerns not answered by these instructions  ######################################################################    1. DIET: Follow a light bland diet & liquids the first 24 hours after arrival home, such as soup, liquids, starches, etc.  Be sure to drink plenty of fluids.  Quickly advance to a usual solid diet within a few days.  Avoid fast food or heavy meals as your are more likely to get nauseated or have irregular bowels.  A low-fat, high-fiber diet for the rest of your life is ideal.  2. Take your usually prescribed home medications unless otherwise directed.  3. PAIN CONTROL: a. Pain is best controlled by a usual combination of three different methods TOGETHER: i. Ice/Heat ii. Over the counter pain medication iii. Prescription pain medication b. Most patients will experience some swelling and bruising around the incisions.  Ice packs or heating pads (30-60 minutes up to 6 times a day) will help. Use ice for the first few days to help decrease swelling and bruising, then switch to heat to help relax tight/sore spots and speed recovery.  Some people prefer to use ice alone, heat alone, alternating between ice & heat.   Experiment to what works for you.  Swelling and bruising can take several weeks to resolve.   c. It is helpful to take an over-the-counter pain medication regularly for the first few weeks.  Choose one of the following that works best for you: i. Naproxen (Aleve, etc)  Two 237m tabs twice a day ii. Ibuprofen (Advil, etc) Three 2013mtabs four times a day (every meal & bedtime) iii. Acetaminophen (Tylenol, etc) 500-6508mour times a day (every meal & bedtime) d. A  prescription for pain medication (such as oxycodone, hydrocodone, tramadol, gabapentin, methocarbamol, etc) should be given to you upon discharge.  Take your pain medication as prescribed.  i. If you are having problems/concerns with the prescription medicine (does not control pain, nausea, vomiting, rash, itching, etc), please call us [phone] see if we need to switch you to a different pain medicine that will work better for you and/or control your side effect better. ii. If you need a refill on your pain medication, please give us [place] hour notice.  contact your pharmacy.  They will contact our office to request authorization. Prescriptions will not be filled after 5 pm or on week-ends  4. Avoid getting constipated.   a. Between the surgery and the pain medications, it is common to experience some constipation.   b. Increasing fluid intake and taking a fiber supplement (such as Metamucil, Citrucel, FiberCon, MiraLax, etc) 1-2 times a day regularly will usually help prevent this problem from occurring.   c. A mild laxative (prune juice, Milk of Magnesia, MiraLax, etc) should be taken according to  package directions if there are no bowel movements after 48 hours.   5. Watch out for diarrhea.   a. If you have many loose bowel movements, simplify your diet to bland foods & liquids for a few days.   b. Stop any stool softeners and decrease your fiber supplement.   c. Switching to mild anti-diarrheal medications (Kayopectate, Pepto  Bismol) can help.   d. If this worsens or does not improve, please call us.  6. Wash / shower every day.  You may shower over the dressings as they are waterproof.  Continue to shower over incision(s) after the dressing is off.  7. Remove your waterproof bandages 5 days after surgery.  You may leave the incision open to air.  You may replace a dressing/Band-Aid to cover the incision for comfort if you wish.   8. ACTIVITIES as tolerated:   a. You may resume regular (light) daily activities beginning the next day--such as daily self-care, walking, climbing stairs--gradually increasing activities as tolerated.  If you can walk 30 minutes without difficulty, it is safe to try more intense activity such as jogging, treadmill, bicycling, low-impact aerobics, swimming, etc. b. Save the most intensive and strenuous activity for last such as sit-ups, heavy lifting, contact sports, etc  Refrain from any heavy lifting or straining until you are off narcotics for pain control.   c. DO NOT PUSH THROUGH PAIN.  Let pain be your guide: If it hurts to do something, don't do it.  Pain is your body warning you to avoid that activity for another week until the pain goes down. d. You may drive when you are no longer taking prescription pain medication, you can comfortably wear a seatbelt, and you can safely maneuver your car and apply brakes. e. Dennis Bast may have sexual intercourse when it is comfortable.  9. FOLLOW UP in our office a. Please call [facility] at [phone] to set up an appointment to see your surgeon in the office for a follow-up appointment approximately 2-3 weeks after your surgery. b. Make sure that you call for this appointment the day you arrive home to insure a convenient appointment time.  10. IF YOU HAVE DISABILITY OR FAMILY LEAVE FORMS, BRING THEM TO THE OFFICE FOR PROCESSING.  DO NOT GIVE THEM TO YOUR DOCTOR.   WHEN TO CALL us [phone]: 1. Poor pain control 2. Reactions / problems with new  medications (rash/itching, nausea, etc)  3. Fever over 101.5 F (38.5 C) 4. Inability to urinate 5. Nausea and/or vomiting 6. Worsening swelling or bruising 7. Continued bleeding from incision. 8. Increased pain, redness, or drainage from the incision   The clinic staff is available to answer your questions during regular business hours (8:30am-5pm).  Please dont hesitate to call and ask to speak to one of our nurses for clinical concerns.   If you have a medical emergency, go to the nearest emergency room or call 911.  A surgeon from [facility] is always on call at the [facility], [place], [place], [place], [place]  [address] ? MAIN: [phone] ? TOLL FREE: [phone] ?  FAX [phone] [email or web address]   Gallbladder Eating Plan If you have a gallbladder condition, you may have trouble digesting fats. Eating a low-fat diet can help reduce your symptoms, and may be helpful before and after having surgery to remove your gallbladder (cholecystectomy). Your health care provider may recommend that you work with a diet and  nutrition specialist (dietitian) to help you reduce the amount of fat in your diet. What are tips for following this plan? General guidelines  Limit your fat intake to less than 30% of your total daily calories. If you eat around 1,800 calories each day, this is less than 60 grams (g) of fat per day.  Fat is an important part of a healthy diet. Eating a low-fat diet can make it hard to maintain a healthy body weight. Ask your dietitian how much fat, calories, and other nutrients you need each day.  Eat small, frequent meals throughout the day instead of three large meals.  Drink at least 8-10 cups of fluid a day. Drink enough fluid to keep your urine clear or pale yellow.  Limit alcohol intake to no more than 1 drink a day for nonpregnant women and 2 drinks a day for men. One drink equals  12 oz of beer, 5 oz of wine, or 1 oz of hard liquor. Reading food labels  Check Nutrition Facts on food labels for the amount of fat per serving. Choose foods with less than 3 grams of fat per serving. Shopping  Choose nonfat and low-fat healthy foods. Look for the words nonfat, low fat, or fat free.  Avoid buying processed or prepackaged foods. Cooking  Cook using low-fat methods, such as baking, broiling, grilling, or boiling.  Cook with small amounts of healthy fats, such as olive oil, grapeseed oil, canola oil, or sunflower oil. What foods are recommended?   All fresh, frozen, or canned fruits and vegetables.  Whole grains.  Low-fat or non-fat (skim) milk and yogurt.  Lean meat, skinless poultry, fish, eggs, and beans.  Low-fat protein supplement powders or drinks.  Spices and herbs. What foods are not recommended?  High-fat foods. These include baked goods, fast food, fatty cuts of meat, ice cream, french toast, sweet rolls, pizza, cheese bread, foods covered with butter, creamy sauces, or cheese.  Fried foods. These include french fries, tempura, battered fish, breaded chicken, fried breads, and sweets.  Foods with strong odors.  Foods that cause bloating and gas. Summary  A low-fat diet can be helpful if you have a gallbladder condition, or before and after gallbladder surgery.  Limit your fat intake to less than 30% of your total daily calories. This is about 60 g of fat if you eat 1,800 calories each day.  Eat small, frequent meals throughout the day instead of three large meals. This information is not intended to replace advice given to you by your health care provider. Make sure you discuss any questions you have with your health care provider. Document Released: 05/16/2013 Document Revised: 09/01/2018 Document Reviewed: 06/18/2016 Elsevier Patient Education  2020 [employer]

## 2019-02-05 NOTE — Progress Notes (Signed)
Patient discharged to home with instructions. 

## 2019-02-07 NOTE — H&P (Signed)
Reason for Consult:Acute Cholecystitis Referring Physician: Dr. Theotis Burrow, MD  Lauren Gilbert is an 43 y.o. female.  HPI:   Lauren Gilbert is a 43 yo female with a history of IBS-C who presented to the ED on 02/04/2019 for severe abdominal pain.  She reports that the epigastric pain began on Tuesday, 01/31/2019, awaking her from sleep.  The pain has persisted since Tuesday, and worsens with ambulation and eating.  She has associated decreased appetite, nausea, and  one episode of vomiting on Tuesday.  She denies fevers, or chills.  She also notes constipation throughout this week, passing only a small amount of hard stool.  She has been taking miralax, for no significant relief of symptoms.    At the ED, patient's labs consistent with WBC elevation of 17.9, normal liver enzymes, normal lipase.  CT abdomen demonstrated gallbladder distension and gallbladder wall thickening of 12 mm.  S/sx consistent with acute cholecystitis.  Patient has been started on treatment with zosyn in the ED.   Patient has been NPO since Friday morning 02/03/2019.  No significant past medical history.    No past surgical history.    No family history on file.  Social History:  reports that she has never smoked. She has never used smokeless tobacco. She reports current alcohol use. She reports that she does not use drugs.  Allergies: No Known Allergies  Medications:         Results for orders placed or performed during the hospital encounter of 02/03/19 (from the past 48 hour(s))  I-Stat Beta hCG blood, ED ([facility], [facility], [facility] only)     Status: None   Collection Time: 02/04/19  7:58 AM  Result Value Ref Range   I-stat hCG, quantitative <5.0 <5 mIU/mL   Comment 3            Comment:   GEST. AGE      CONC.  (mIU/mL)   <=1 WEEK        5 - 50     2 WEEKS       50 - 500     3 WEEKS       100 - 10,000     4 WEEKS     1,000 - 30,000        FEMALE AND NON-PREGNANT FEMALE:     LESS THAN 5 mIU/mL   SARS  Coronavirus 2 [facility] order, Performed in [facility] hospital lab) Nasopharyngeal Nasopharyngeal Swab     Status: None   Collection Time: 02/04/19 10:50 AM   Specimen: Nasopharyngeal Swab  Result Value Ref Range   SARS Coronavirus 2 NEGATIVE NEGATIVE    Comment: (NOTE) If result is NEGATIVE SARS-CoV-2 target nucleic acids are NOT DETECTED. The SARS-CoV-2 RNA is generally detectable in upper and lower  respiratory specimens during the acute phase of infection. The lowest  concentration of SARS-CoV-2 viral copies this assay can detect is 250  copies / mL. A negative result does not preclude SARS-CoV-2 infection  and should not be used as the sole basis for treatment or other  patient management decisions.  A negative result may occur with  improper specimen collection / handling, submission of specimen other  than nasopharyngeal swab, presence of viral mutation(s) within the  areas targeted by this assay, and inadequate number of viral copies  (<250 copies / mL). A negative result must be combined with clinical  observations, patient history, and epidemiological information. If result is POSITIVE SARS-CoV-2 target nucleic acids are DETECTED.  The SARS-CoV-2 RNA is generally detectable in upper and lower  respiratory specimens dur ing the acute phase of infection.  Positive  results are indicative of active infection with SARS-CoV-2.  Clinical  correlation with patient history and other diagnostic information is  necessary to determine patient infection status.  Positive results do  not rule out bacterial infection or co-infection with other viruses. If result is PRESUMPTIVE POSTIVE SARS-CoV-2 nucleic acids MAY BE PRESENT.   A presumptive positive result was obtained on the submitted specimen  and confirmed on repeat testing.  While 2019 novel coronavirus  (SARS-CoV-2) nucleic acids may be present in the submitted sample  additional confirmatory testing may be necessary for  epidemiological  and / or clinical management purposes  to differentiate between  SARS-CoV-2 and other Sarbecovirus currently known to infect humans.  If clinically indicated additional testing with an alternate test  methodology [phone]([id number]) is advised. The SARS-CoV-2 RNA is generally  detectable in upper and lower respiratory sp ecimens during the acute  phase of infection. The expected result is Negative. Fact Sheet for Patients:  BoilerBrush.com.cyhttps://www.fda.gov/media/136312/download Fact Sheet for Healthcare Providers: https://pope.com/https://www.fda.gov/media/136313/download This test is not yet approved or cleared by the [place] FDA and has been authorized for detection and/or diagnosis of SARS-CoV-2 by FDA under an Emergency Use Authorization (EUA).  This EUA will remain in effect (meaning this test can be used) for the duration of the COVID-19 declaration under Section 564(b)(1) of the Act, 21 U.S.C. section 360bbb-3(b)(1), unless the authorization is terminated or revoked sooner. Performed at [facility], [address] [place], [place] [address]     Ct Abdomen Pelvis W Contrast  Result Date: 02/04/2019 CLINICAL DATA:  Acute abdominal pain. EXAM: CT ABDOMEN AND PELVIS WITH CONTRAST TECHNIQUE: Multidetector CT imaging of the abdomen and pelvis was performed using the standard protocol following bolus administration of intravenous contrast. CONTRAST:  100mL OMNIPAQUE IOHEXOL 300 MG/ML  SOLN COMPARISON:  None. FINDINGS: Lower chest: Normal. Hepatobiliary: There is marked thickening of the gallbladder wall to 12 mm. The gallbladder is distended. No dilated bile ducts. Liver parenchyma is normal. Pancreas: Unremarkable. No pancreatic ductal dilatation or surrounding inflammatory changes. Spleen: Normal in size without focal abnormality. Adrenals/Urinary Tract: Adrenal glands are unremarkable. Kidneys are normal, without renal calculi, focal lesion, or hydronephrosis. Bladder is unremarkable.  Stomach/Bowel: Stomach is within normal limits. Appendix appears normal. No evidence of bowel wall thickening, distention, or inflammatory changes. Vascular/Lymphatic: No significant vascular findings are present. No enlarged abdominal or pelvic lymph nodes. Reproductive: Uterus and bilateral adnexa are unremarkable. 18 mm Bartholin's cyst at the right labia. Other: No abdominal wall hernia or abnormality. No abdominopelvic ascites. Musculoskeletal: No acute or significant osseous findings. IMPRESSION: Marked thickening of the gallbladder wall consistent with cholecystitis. This could be acute or chronic. Otherwise, essentially normal exam. Electronically Signed   By: Francene BoyersJames  Maxwell M.D.   On: 02/04/2019 09:04   Dg Abd 2 Views  Result Date: 02/03/2019 CLINICAL DATA:  Acute abdominal pain for 4 days. EXAM: ABDOMEN - 2 VIEW COMPARISON:  None. FINDINGS: The bowel gas pattern is normal. There is no evidence of free air. No radio-opaque calculi or other significant radiographic abnormality is seen. IMPRESSION: Negative. Electronically Signed   By: Harmon PierJeffrey  Hu M.D.   On: 02/03/2019 18:02    Review of Systems  Constitutional: Negative for chills and fever.  Respiratory: Negative for shortness of breath.   Gastrointestinal: Positive for abdominal pain, constipation and nausea.  All other systems  reviewed and are negative.  Blood pressure 130/84, pulse 72, temperature 98.4 F (36.9 C), temperature source Oral, resp. rate 12, last menstrual period 02/03/2019, SpO2 98 %. Physical Exam  Constitutional: She is oriented to person, place, and time. She appears well-developed and well-nourished. No distress.  Cardiovascular: Normal rate, regular rhythm and normal heart sounds. Exam reveals no gallop and no friction rub.  No murmur heard. Respiratory: Effort normal and breath sounds normal.  GI: Bowel sounds are normal. She exhibits no distension and no mass. There is abdominal tenderness (in the epigastric  and RUQ).  Musculoskeletal: Normal range of motion.  Neurological: She is alert and oriented to person, place, and time.  Skin: Skin is warm and dry.  Psychiatric: She has a normal mood and affect. Her behavior is normal. Judgment and thought content normal.      Assessment/Plan: 43 yo female with 5 days of epigastric and RUQ pain that has been constant with work up consistent with acute cholecystitis given leukocytosis and CT findings. -lap chole -broad abx We discussed the etiology of her pain, we discussed treatment options and recommended surgery. We discussed details of surgery including general anesthesia, laparoscopic approach, identification of cystic duct and common bile duct. Ligation of cystic duct and cystic artery. Possible need for intraoperative cholangiogram or open procedure. Possible risks of common bile duct injury, liver injury, cystic duct leak, bleeding, infection, post-cholecystectomy syndrome. The patient showed good understanding and all questions were answered

## 2019-02-07 NOTE — Discharge Summary (Addendum)
Physician Discharge Summary  Patient ID: Lauren Gilbert MRN: [id number] DOB/AGE: Jul 09, 1975 43 y.o.  Admit date: 02/03/2019 Discharge date: 02/05/2019  Admission Diagnoses:  Acute cholecystitis/cholelithiasis with nausea and vomiting Hx IBS-C  Discharge Diagnoses:  Acute cholecystitis/cholelithiasis Hx IBS-C COVID negative   Active Problems:   Acute cholecystitis   PROCEDURES: Laparoscopic cholecystectomy 02/04/2019 [facility]  Hospital Course:  Ms. Lauren Gilbert is a 43 yo female with a history of IBS-C who presented to the ED on 02/04/2019 for severe abdominal pain. She reports that the epigastric pain began on Tuesday, 01/31/2019, awaking her from sleep. The pain has persisted since Tuesday, and worsens with ambulation and eating. She has associated decreased appetite, nausea, and one episode of vomiting on Tuesday. She denies fevers, or chills. She also notes constipation throughout this week, passing only a small amount of hard stool. She has been taking miralax, for no significant relief of symptoms.   At the ED, patient's labs consistent with WBC elevation of 17.9, normal liver enzymes, normal lipase. CT abdomen demonstrated gallbladder distension and gallbladder wall thickening of 12 mm. S/sx consistent with acute cholecystitis. Patient has been started on treatment with zosyn in the ED. Patient has been NPO since Friday morning 02/03/2019.  Patient was admitted in the ED by Dr. Sheliah HatchKinsinger and taken to the operating room.  She underwent laparoscopic cholecystectomy.  She tolerated the procedure well.  The first morning she was tolerating a soft diet, mobilizing, pain was well controlled with oral agents.  She was ready for discharge the first postoperative day.  CBC Latest Ref Rng & Units 02/05/2019 02/03/2019  WBC 4.0 - 10.5 K/uL 14.2(H) 17.9(H)  Hemoglobin 12.0 - 15.0 g/dL 11.4(L) 12.7  Hematocrit 36.0 - 46.0 % 34.6(L) 38.2  Platelets 150 - 400 K/uL 403(H) 414(H)    CMP Latest Ref Rng & Units 02/05/2019 02/03/2019  Glucose 70 - 99 mg/dL 409(W128(H) 119(J104(H)  BUN 6 - 20 mg/dL 8 <4(N<5(L)  Creatinine 8.290.44 - 1.00 mg/dL 5.620.83 1.300.90  Sodium 865135 - 145 mmol/L 138 138  Potassium 3.5 - 5.1 mmol/L 3.8 3.5  Chloride 98 - 111 mmol/L 102 101  CO2 22 - 32 mmol/L 26 28  Calcium 8.9 - 10.3 mg/dL 9.0 9.1  Total Protein 6.5 - 8.1 g/dL - 7.8  Total Bilirubin 0.3 - 1.2 mg/dL - 1.1  Alkaline Phos 38 - 126 U/L - 61  AST 15 - 41 U/L - 25  ALT 0 - 44 U/L - 29    CT with contrast 02/04/2019: Marked thickening gallbladder wall consistent with cholecystitis could be acute or chronic.   Disposition: Home   Allergies as of 02/05/2019   No Known Allergies     Medication List    TAKE these medications   acetaminophen 500 MG tablet Commonly known as: TYLENOL Take 1000 milligrams every 8 hours as needed for pain.  Can alternate this with plain ibuprofen.  You can buy this over-the-counter at any drugstore.  Do not exceed 4000 mg of Tylenol per day it can harm your liver.   ibuprofen 200 MG tablet Commonly known as: ADVIL You can take 2 to 3 tablets every 6 hours as needed for pain.  Alternate with plain Tylenol.  You can buy this over-the-counter at any drugstore.   oxyCODONE 5 MG immediate release tablet Commonly known as: Oxy IR/ROXICODONE Take 1 tablet (5 mg total) by mouth every 6 (six) hours as needed for severe pain or breakthrough pain (Pain not relieved by Tylenol/ibuprofen).  Follow-up Information    [facility] [place] Follow up.   Specialty: General Surgery Why: Our office should call you with a follow-up appointment with the [facility] clinic.  If you do not hear from them by Wednesday please call and ask.  Be at the office 30 minutes early before your appointment scheduled for check-in.  Bring photo ID and insurance  Contact information: [address] [phone]           Signed: Earnstine Regal 02/07/2019, 3:07  PM  Agree with above.  Alphonsa Overall, MD, [facility] Pager: [phone] Office phone:  [phone]

## 2019-08-26 ENCOUNTER — Ambulatory Visit: Payer: BC Managed Care – PPO | Attending: Internal Medicine

## 2019-08-26 DIAGNOSIS — Z23 Encounter for immunization: Secondary | ICD-10-CM

## 2019-08-26 NOTE — Progress Notes (Signed)
   Covid-19 Vaccination Clinic  Name:  Lauren Gilbert    MRN: [id number] DOB: Apr 22, 1976  08/26/2019  [name] was observed post Covid-19 immunization for 15 minutes without incident. She was provided with Vaccine Information Sheet and instruction to access the V-Safe system.   [name] was instructed to call 911 with any severe reactions post vaccine: Marland Kitchen Difficulty breathing  . Swelling of face and throat  . A fast heartbeat  . A bad rash all over body  . Dizziness and weakness   Immunizations Administered    Name Date Dose VIS Date Route   Pfizer COVID-19 Vaccine 08/26/2019 10:06 AM 0.3 mL 05/05/2019 Intramuscular   Manufacturer: [employer], [employer]   Lot: [id number]   NDC: [address]-5

## 2019-09-19 ENCOUNTER — Ambulatory Visit: Payer: BC Managed Care – PPO | Attending: Internal Medicine

## 2019-09-21 ENCOUNTER — Ambulatory Visit: Payer: BC Managed Care – PPO | Attending: Internal Medicine

## 2019-09-21 DIAGNOSIS — Z23 Encounter for immunization: Secondary | ICD-10-CM

## 2019-09-21 NOTE — Progress Notes (Signed)
   Covid-19 Vaccination Clinic  Name:  Lauren Gilbert    MRN: [id number] DOB: [birth year]  09/21/2019  [name] was observed post Covid-19 immunization for 15 minutes without incident. She was provided with Vaccine Information Sheet and instruction to access the V-Safe system.   [name] was instructed to call 911 with any severe reactions post vaccine: Marland Kitchen Difficulty breathing  . Swelling of face and throat  . A fast heartbeat  . A bad rash all over body  . Dizziness and weakness   Immunizations Administered    Name Date Dose VIS Date Route   Pfizer COVID-19 Vaccine 09/21/2019  4:50 PM 0.3 mL 07/19/2018 Intramuscular   Manufacturer: [employer], [employer]   Lot: [id number]   NDC: [address]-5

## 2021-01-10 IMAGING — DX DG ABDOMEN 2V
3 series · 3 of 3 positions shown · non-contrast
Comparison: None.

CLINICAL DATA: Acute abdominal pain for 4 days.

EXAM:
ABDOMEN - 2 VIEW

[abdomen erect]
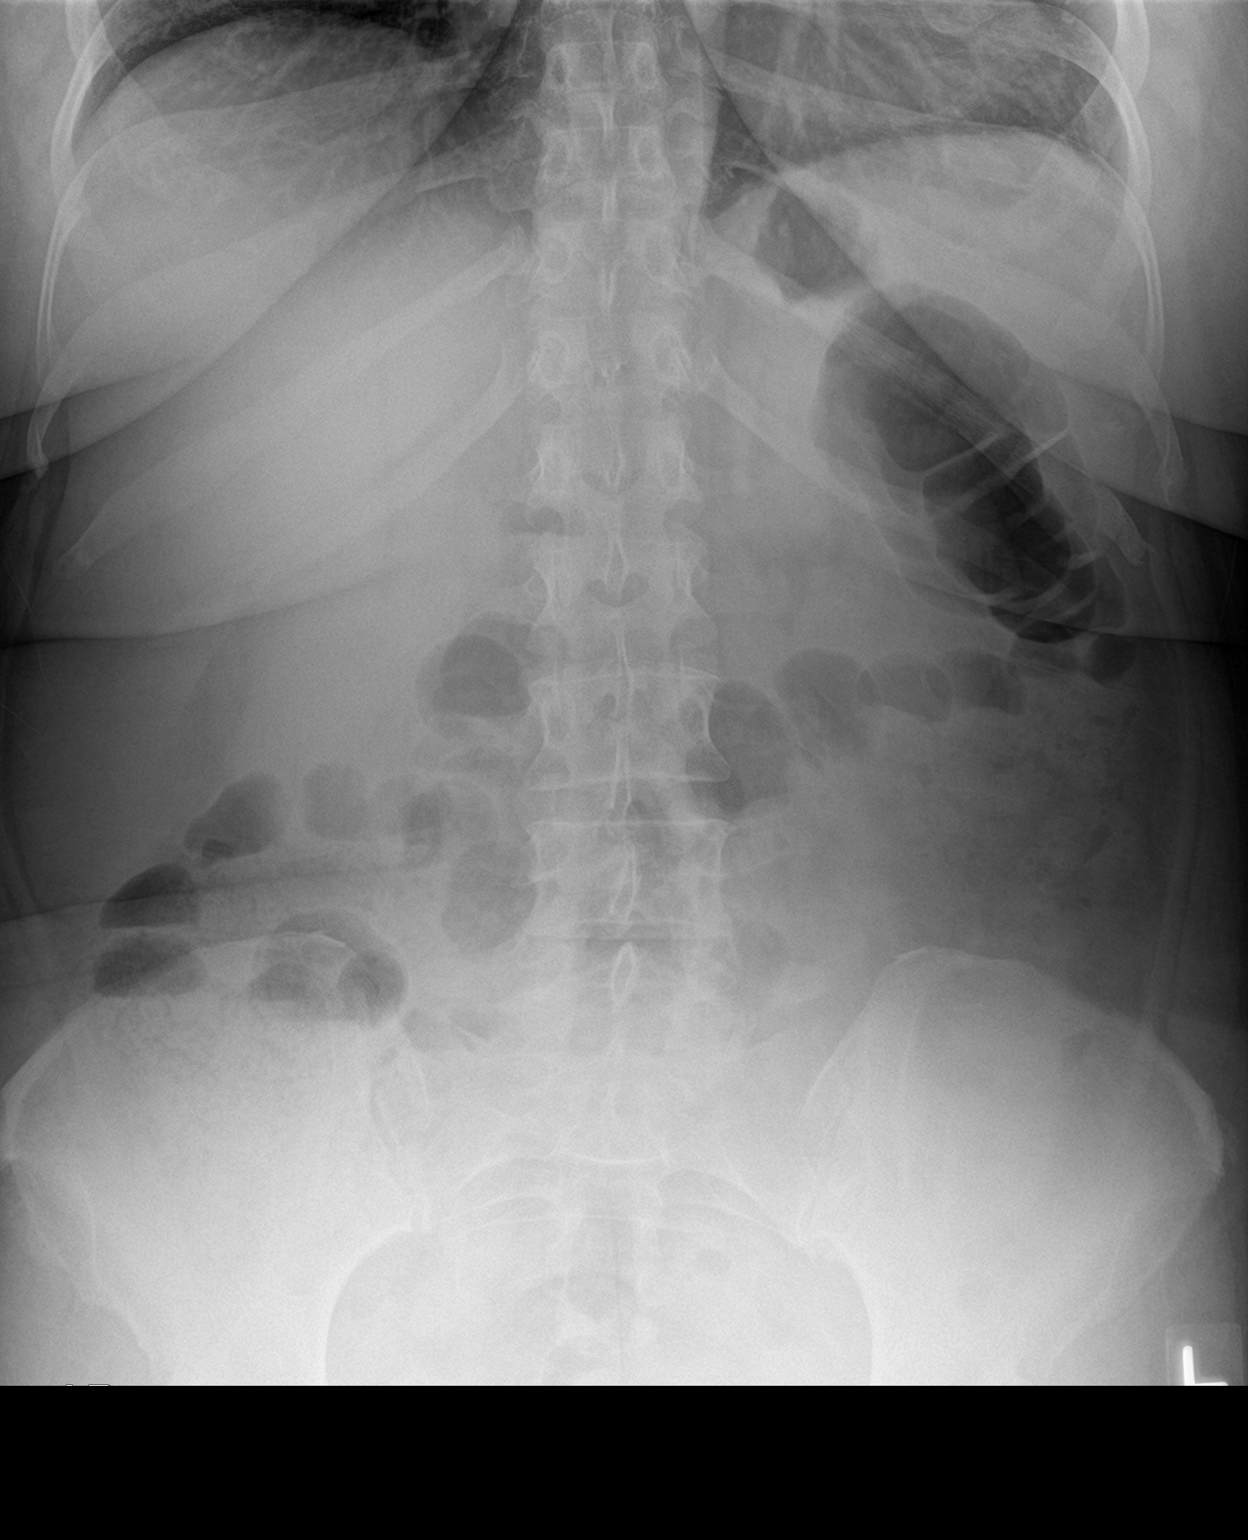

[abdomen supine (1 of 2)]
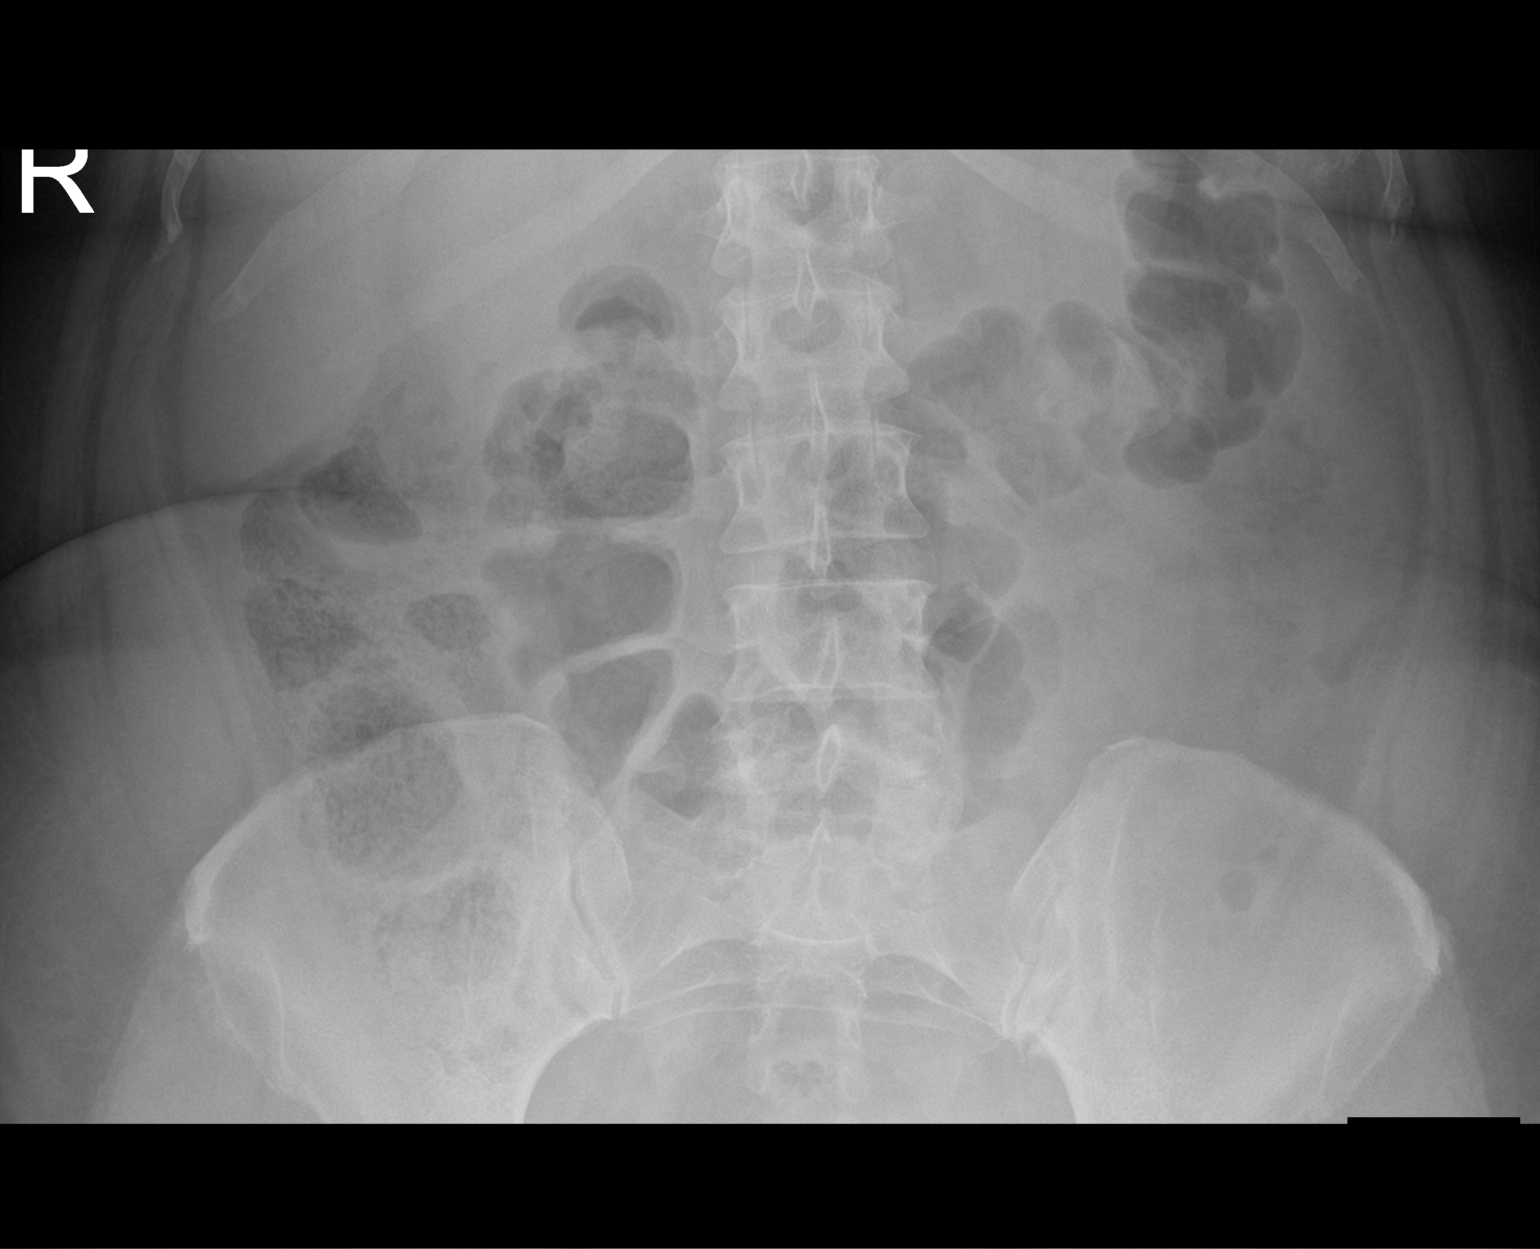

[abdomen supine (2 of 2)]
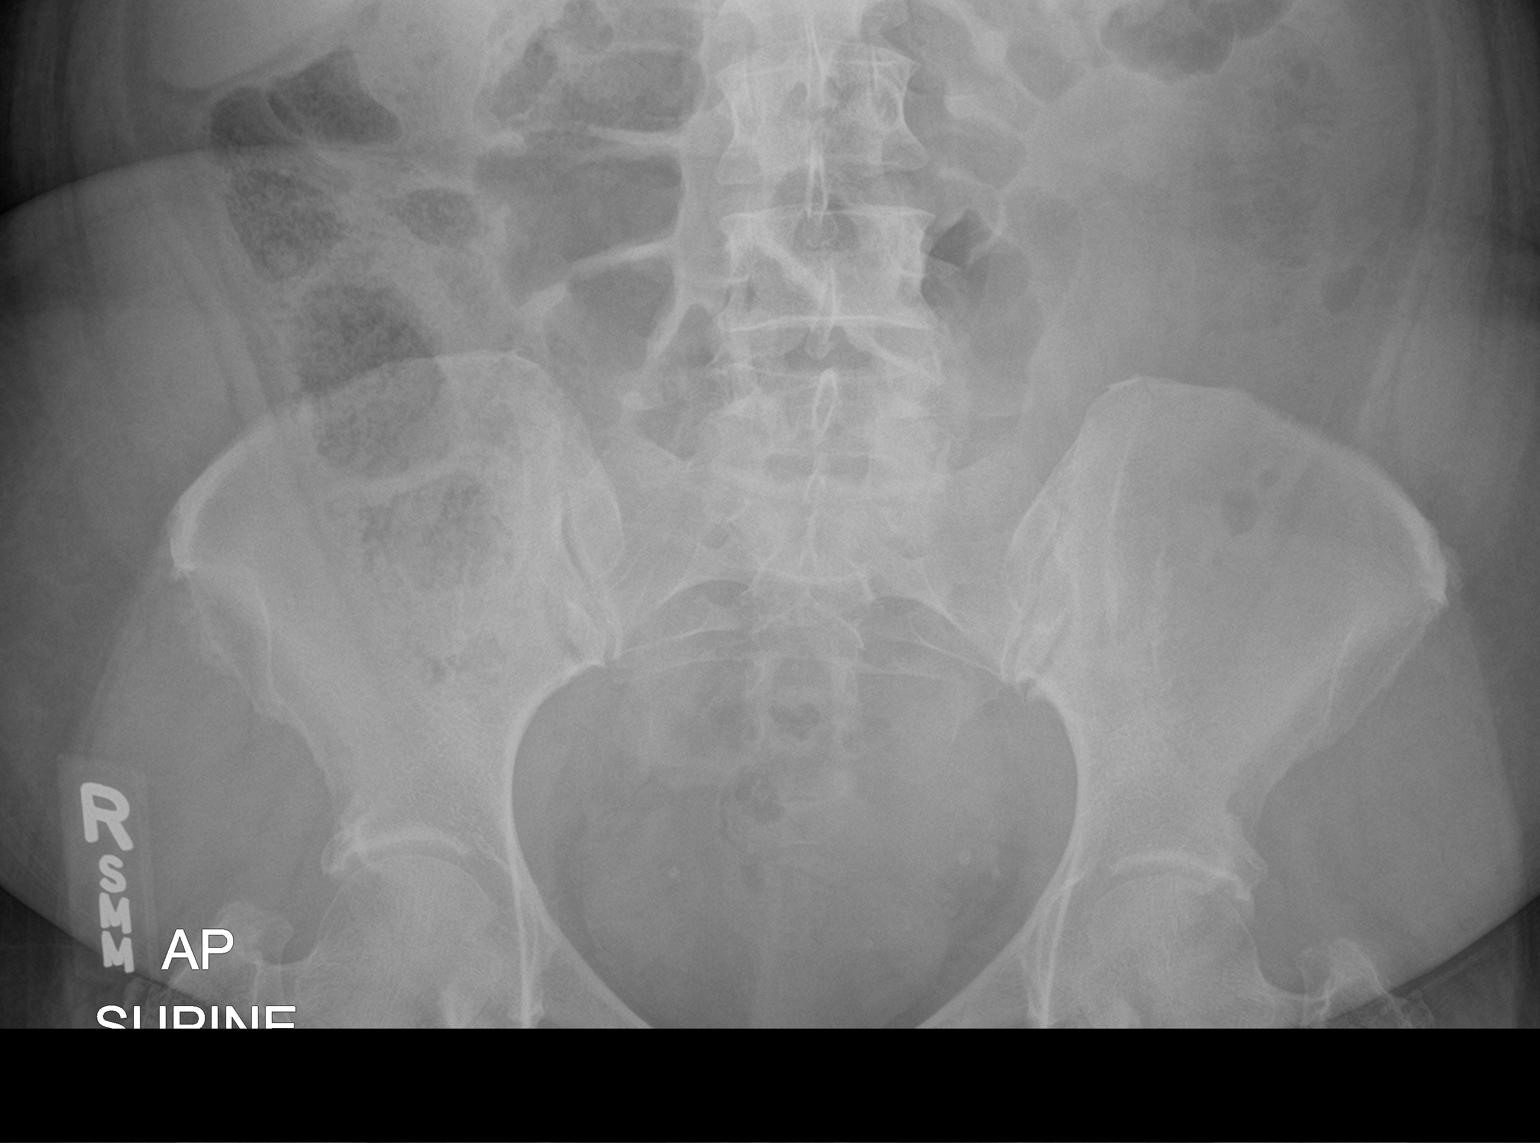

[3 of 3 positions shown; findings below may reference images not displayed]

FINDINGS: The bowel gas pattern is normal. There is no evidence of free air.
No radio-opaque calculi or other significant radiographic
abnormality is seen.
IMPRESSION: Negative.

## 2022-01-09 ENCOUNTER — Ambulatory Visit (INDEPENDENT_AMBULATORY_CARE_PROVIDER_SITE_OTHER): Payer: BC Managed Care – PPO

## 2022-01-09 ENCOUNTER — Ambulatory Visit (HOSPITAL_COMMUNITY)
Admission: EM | Admit: 2022-01-09 | Discharge: 2022-01-09 | Disposition: A | Discharge status: Home / Self Care | Payer: BC Managed Care – PPO | Attending: Family Medicine | Admitting: Family Medicine

## 2022-01-09 ENCOUNTER — Encounter (HOSPITAL_COMMUNITY): Payer: Self-pay

## 2022-01-09 DIAGNOSIS — M25522 Pain in left elbow: Secondary | ICD-10-CM

## 2022-01-09 DIAGNOSIS — W19XXXA Unspecified fall, initial encounter: Secondary | ICD-10-CM

## 2022-01-09 DIAGNOSIS — M79602 Pain in left arm: Secondary | ICD-10-CM

## 2022-01-09 NOTE — Discharge Instructions (Addendum)
You were seen today for arm injury after fall.  Your xray does not show an obvious fracture, but there is still concern for this.  We have placed you in a splint today with a sling.  Please call [facility] at [phone] to make an appointment.

## 2022-01-09 NOTE — Progress Notes (Signed)
Orthopedic Tech Progress Note Patient Details:  Lauren Gilbert Oct 06, 1975 [id number]  Ortho Devices Type of Ortho Device: Sling immobilizer, Long arm splint Ortho Device/Splint Location: LUE Ortho Device/Splint Interventions: Adjustment, Ordered, Application   Post Interventions Patient Tolerated: Well Instructions Provided: Care of device  [place] A Gerilyn Pilgrim 01/09/2022, 10:51 AM

## 2022-01-09 NOTE — ED Triage Notes (Signed)
Pt states fell from standing in a chair and landing in the floor on her lt side. C/o LLE pain from lt elbow to lt pinky. Taking ibuprofen and icing with little relief.

## 2022-01-09 NOTE — ED Provider Notes (Signed)
**Note Lauren-Identified via Obfuscation** [facility]    CSN: [id number] Arrival date & time: 01/09/22  0859      History   Chief Complaint Chief Complaint  Patient presents with   Fall    HPI Lauren Gilbert is a 46 y.o. female.   She is here for a fall from yesterday.  She was standing on a chair, and while stepping down her foot snagged on her pants leg.  She landed on the left side, mostly on her left arm/elbow.   She was sore initially, had full rom, but then started with more sharp pains at the left elbow.  She is having soreness into the last 2 fingers with movement;  feels stiff.  She has used ice, as well as motrin.  This morning seems more stiff, uncomfortable.   History reviewed. No pertinent past medical history.  Patient Active Problem List   Diagnosis Date Noted   Acute cholecystitis 02/04/2019    Past Surgical History:  Procedure Laterality Date   CHOLECYSTECTOMY N/A 02/04/2019   Procedure: LAPAROSCOPIC CHOLECYSTECTOMY;  Surgeon: Lauren Gilbert, Lauren Blanch, MD;  Location: [facility] OR;  Service: General;  Laterality: N/A;    OB History   No obstetric history on file.      Home Medications    Prior to Admission medications   Medication Sig Start Date End Date Taking? Authorizing Provider  acetaminophen (TYLENOL) 500 MG tablet Take 1000 milligrams every 8 hours as needed for pain.  Can alternate this with plain ibuprofen.  You can buy this over-the-counter at any drugstore.  Do not exceed 4000 mg of Tylenol per day it can harm your liver. 02/05/19   Lauren George, PA-C  ibuprofen (ADVIL) 200 MG tablet You can take 2 to 3 tablets every 6 hours as needed for pain.  Alternate with plain Tylenol.  You can buy this over-the-counter at any drugstore. 02/05/19   Lauren George, PA-C    Family History History reviewed. No pertinent family history.  Social History Social History   Tobacco Use   Smoking status: Never   Smokeless tobacco: Never  Vaping Use   Vaping Use: Never used  Substance  Use Topics   Alcohol use: Yes    Comment: ocassional   Drug use: Never     Allergies   Patient has no known allergies.   Review of Systems Review of Systems  Constitutional: Negative.   HENT: Negative.    Respiratory: Negative.    Cardiovascular: Negative.   Gastrointestinal: Negative.   Genitourinary: Negative.      Physical Exam Triage Vital Signs ED Triage Vitals [01/09/22 0920]  Enc Vitals Gilbert     BP (!) 167/95     Pulse Rate 79     Resp 18     Temp 98 F (36.7 C)     Temp Source Oral     SpO2 99 %     Weight      Height      Head Circumference      Peak Flow      Pain Score 3     Pain Loc      Pain Edu?      Excl. in GC?    No data found.  Updated Vital Signs BP (!) 167/95 (BP Location: Left Arm)   Pulse 79   Temp 98 F (36.7 C) (Oral)   Resp 18   LMP 12/26/2021   SpO2 99%   Visual Acuity Right Eye Distance:   Left Eye Distance:  Bilateral Distance:    Right Eye Near:   Left Eye Near:    Bilateral Near:     Physical Exam Musculoskeletal:     Comments: Swelling noted to the left elbow;  unable to extend elbow fully due to pain;  TTP to the proximal ulna;  No TTP to the forearm, hand or fingers;  full rom otherwise;  Skin:    General: Skin is warm.     Findings: No bruising.  Neurological:     General: No focal deficit present.     Mental Status: She is alert and oriented to person, place, and time.  Psychiatric:        Mood and Affect: Mood normal.      UC Treatments / Results  Labs (all labs ordered are listed, but only abnormal results are displayed) Labs Reviewed - No data to display  EKG   Radiology DG Elbow Complete Left  Result Date: 01/09/2022 CLINICAL DATA:  Larey Seat, left elbow pain EXAM: LEFT ELBOW - COMPLETE 3+ VIEW COMPARISON:  None Available. FINDINGS: Frontal, oblique, lateral views of the left elbow are obtained. No acute displaced fractures identified. However, there is a large elbow effusion with elevation  of the anterior and posterior fat pads. In a skeletally mature patient, this suggests an occult radial head fracture in the setting of trauma. Alignment is anatomic. 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 Soft tissues are otherwise unremarkable. IMPRESSION: 1. Large left elbow effusion, with no evidence of underlying displaced fracture. In a skeletally mature patient in the setting of trauma, this suggests an occult radial head fracture. Electronically Signed   By: Lauren Gilbert M.D.   On: 01/09/2022 09:54    Procedures Procedures (including critical care time)  Medications Ordered in UC Medications - No data to display  Initial Impression / Assessment and Plan / UC Course  I have reviewed the triage vital signs and the nursing notes.  Pertinent labs & imaging results that were available during my care of the patient were reviewed by me and considered in my medical decision making (see chart for details).  She was seen today for arm pain after fall;  Xray was negative for occult fracture, but concerning given effusion and fat pad.  She was splinted today, placed in a sling, and advised to call ortho for follow up.  She is aware and agrees with plan.   Final Clinical Impressions(s) / UC Diagnoses   Final diagnoses:  Fall, initial encounter  Left arm pain     Discharge Instructions      You were seen today for arm injury after fall.  Your xray does not show an obvious fracture, but there is still concern for this.  We have placed you in a splint today with a sling.  Please call Lauren Gilbert at [phone] to make an appointment.     ED Prescriptions   None    PDMP not reviewed this encounter.   Lauren Franklin, MD 01/09/22 1048

## 2023-08-25 ENCOUNTER — Encounter (HOSPITAL_COMMUNITY): Payer: Self-pay

## 2023-08-25 ENCOUNTER — Ambulatory Visit (INDEPENDENT_AMBULATORY_CARE_PROVIDER_SITE_OTHER)

## 2023-08-25 ENCOUNTER — Ambulatory Visit (HOSPITAL_COMMUNITY): Admission: EM | Admit: 2023-08-25 | Discharge: 2023-08-25 | Disposition: A | Discharge status: Home / Self Care

## 2023-08-25 DIAGNOSIS — M25561 Pain in right knee: Secondary | ICD-10-CM

## 2023-08-25 MED ORDER — DICLOFENAC SODIUM 50 MG PO TBEC: 50.0000 mg | DELAYED_RELEASE_TABLET | Freq: Two times a day (BID) | ORAL | 1 refills | Status: AC

## 2023-08-25 NOTE — Discharge Instructions (Addendum)
 1. Acute pain of right knee (Primary) - DG Knee Complete 4 Views Right x-ray shows no acute fracture or dislocation, possible degenerative changes noted to intra-articular surface - diclofenac (VOLTAREN) 50 MG EC tablet; Take 1 tablet (50 mg total) by mouth 2 (two) times daily.  Dispense: 30 tablet; Refill: 1 - AMB referral to orthopedics for follow-up evaluation of ongoing right knee pain.

## 2023-08-25 NOTE — ED Provider Notes (Signed)
 [facility]  Note:  This document was prepared using Dragon voice recognition software and may include unintentional dictation errors.  MRN: [id number] DOB: 1975/09/18  Subjective:   Lauren Gilbert is a 48 y.o. female presenting for right knee pain x 2 weeks.  Patient denies any known injury or trauma.  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  Patient states that now she has increased pain after long periods of inactivity and when standing from a seated position.  Patient denies taking any over-the-counter pain medication to treat symptoms.  Denies any past knee injuries.  No current facility-administered medications for this encounter.  Current Outpatient Medications:    diclofenac (VOLTAREN) 50 MG EC tablet, Take 1 tablet (50 mg total) by mouth 2 (two) times daily., Disp: 30 tablet, Rfl: 1   acetaminophen (TYLENOL) 500 MG tablet, Take 1000 milligrams every 8 hours as needed for pain.  Can alternate this with plain ibuprofen.  You can buy this over-the-counter at any drugstore.  Do not exceed 4000 mg of Tylenol per day it can harm your liver., Disp: 30 tablet, Rfl: 0   ibuprofen (ADVIL) 200 MG tablet, You can take 2 to 3 tablets every 6 hours as needed for pain.  Alternate with plain Tylenol.  You can buy this over-the-counter at any drugstore., Disp: , Rfl:    No Known Allergies  History reviewed. No pertinent past medical history.   Past Surgical History:  Procedure Laterality Date   CHOLECYSTECTOMY N/A 02/04/2019   Procedure: LAPAROSCOPIC CHOLECYSTECTOMY;  Surgeon: Kinsinger, De Blanch, MD;  Location: [facility] OR;  Service: General;  Laterality: N/A;    History reviewed. No pertinent family history.  Social History   Tobacco Use   Smoking status: Never   Smokeless tobacco: Never  Vaping Use   Vaping status: Never Used  Substance Use Topics   Alcohol use: Yes    Comment: ocassional   Drug use: Never     ROS Refer to HPI for ROS details.  Objective:   Vitals: BP (!) 146/83 (BP Location: Left Arm)   Pulse 73   Temp 98.4 F (36.9 C) (Oral)   Resp 18   LMP 07/27/2023 (Approximate)   SpO2 97%   Physical Exam Vitals and nursing note reviewed.  Constitutional:      General: She is not in acute distress.    Appearance: Normal appearance. She is well-developed. She is not ill-appearing or toxic-appearing.  HENT:     Head: Normocephalic.  Cardiovascular:     Rate and Rhythm: Normal rate.  Pulmonary:     Effort: Pulmonary effort is normal. No respiratory distress.  Musculoskeletal:     Right knee: Bony tenderness present. No swelling, deformity or erythema. Decreased range of motion. Tenderness present over the medial joint line. Normal alignment and normal patellar mobility. Normal pulse.  Skin:    General: Skin is warm and dry.     Capillary Refill: Capillary refill takes less than 2 seconds.  Neurological:     General: No focal deficit present.     Mental Status: She is alert and oriented to person, place, and time.  Psychiatric:        Mood and Affect: Mood normal.     Procedures  No results found for this or any previous visit (from the past 24 hours).  Assessment and Plan :   PDMP not reviewed this encounter.  1. Acute pain of right  knee    1. Acute pain of right knee (Primary) - DG Knee Complete 4 Views Right x-ray shows no acute fracture or dislocation, possible degenerative changes noted to intra-articular surface - diclofenac (VOLTAREN) 50 MG EC tablet; Take 1 tablet (50 mg total) by mouth 2 (two) times daily.  Dispense: 30 tablet; Refill: 1 - AMB referral to orthopedics for follow-up evaluation of ongoing right knee pain. -Continue to monitor symptoms for any change in severity if there is any escalation of current symptoms or development of new symptoms follow-up in ER for further evaluation and management.  [employer]boy   [place], Ruma B,  [place] 08/25/23 [phone]

## 2023-08-25 NOTE — ED Triage Notes (Signed)
 Pt c/o pain to the inside of rt knee for 2 wks. Denies injury. State pain on standing to sitting. Denies taking any meds.

## 2023-11-04 ENCOUNTER — Other Ambulatory Visit: Payer: Self-pay | Admitting: Orthopedic Surgery

## 2023-11-04 DIAGNOSIS — M25561 Pain in right knee: Secondary | ICD-10-CM

## 2023-11-12 ENCOUNTER — Ambulatory Visit
Admission: RE | Admit: 2023-11-12 | Discharge: 2023-11-12 | Disposition: A | Discharge status: Home / Self Care | Source: Ambulatory Visit | Attending: Orthopedic Surgery | Admitting: Orthopedic Surgery

## 2023-11-12 DIAGNOSIS — M25561 Pain in right knee: Secondary | ICD-10-CM
